# Patient Record
Sex: Female | Born: 1975 | Race: White | Hispanic: No | Marital: Single | Smoking: Never smoker
Health system: Southern US, Community
[De-identification: ages and names within clinical notes are randomized; demographics above are authoritative.]

## PROBLEM LIST (undated history)

## (undated) DIAGNOSIS — D332 Benign neoplasm of brain, unspecified: Secondary | ICD-10-CM

## (undated) DIAGNOSIS — I639 Cerebral infarction, unspecified: Secondary | ICD-10-CM

## (undated) HISTORY — PX: CERVICAL ABLATION: SHX5771

---

## 2000-07-30 ENCOUNTER — Other Ambulatory Visit: Admission: RE | Admit: 2000-07-30 | Discharge: 2000-07-30 | Payer: Self-pay | Admitting: Family Medicine

## 2000-08-11 ENCOUNTER — Other Ambulatory Visit: Admission: RE | Admit: 2000-08-11 | Discharge: 2000-08-11 | Payer: Self-pay | Admitting: Family Medicine

## 2002-01-01 ENCOUNTER — Other Ambulatory Visit: Admission: RE | Admit: 2002-01-01 | Discharge: 2002-01-01 | Payer: Self-pay | Admitting: Family Medicine

## 2004-01-20 ENCOUNTER — Other Ambulatory Visit: Admission: RE | Admit: 2004-01-20 | Discharge: 2004-01-20 | Payer: Self-pay | Admitting: Family Medicine

## 2004-01-20 ENCOUNTER — Other Ambulatory Visit: Admission: RE | Admit: 2004-01-20 | Discharge: 2004-01-20 | Payer: Self-pay | Admitting: *Deleted

## 2004-03-30 ENCOUNTER — Ambulatory Visit (HOSPITAL_COMMUNITY): Admission: RE | Admit: 2004-03-30 | Discharge: 2004-03-30 | Payer: Self-pay | Admitting: Obstetrics and Gynecology

## 2004-07-19 ENCOUNTER — Other Ambulatory Visit: Admission: RE | Admit: 2004-07-19 | Discharge: 2004-07-19 | Payer: Self-pay | Admitting: Obstetrics and Gynecology

## 2004-12-07 ENCOUNTER — Other Ambulatory Visit: Admission: RE | Admit: 2004-12-07 | Discharge: 2004-12-07 | Payer: Self-pay | Admitting: Obstetrics and Gynecology

## 2005-03-08 ENCOUNTER — Other Ambulatory Visit: Admission: RE | Admit: 2005-03-08 | Discharge: 2005-03-08 | Payer: Self-pay | Admitting: Obstetrics and Gynecology

## 2005-09-16 ENCOUNTER — Other Ambulatory Visit: Admission: RE | Admit: 2005-09-16 | Discharge: 2005-09-16 | Payer: Self-pay | Admitting: Obstetrics and Gynecology

## 2011-09-25 ENCOUNTER — Other Ambulatory Visit: Payer: Self-pay | Admitting: Obstetrics and Gynecology

## 2016-01-22 DIAGNOSIS — F329 Major depressive disorder, single episode, unspecified: Secondary | ICD-10-CM | POA: Insufficient documentation

## 2016-01-22 DIAGNOSIS — F32A Depression, unspecified: Secondary | ICD-10-CM | POA: Insufficient documentation

## 2016-02-16 ENCOUNTER — Encounter (HOSPITAL_COMMUNITY): Payer: Self-pay | Admitting: Psychiatry

## 2016-02-16 ENCOUNTER — Ambulatory Visit (INDEPENDENT_AMBULATORY_CARE_PROVIDER_SITE_OTHER): Payer: Managed Care, Other (non HMO) | Admitting: Psychiatry

## 2016-02-16 VITALS — BP 110/62 | HR 80 | Ht 62.5 in | Wt 107.0 lb

## 2016-02-16 DIAGNOSIS — F411 Generalized anxiety disorder: Secondary | ICD-10-CM | POA: Diagnosis not present

## 2016-02-16 DIAGNOSIS — F0630 Mood disorder due to known physiological condition, unspecified: Secondary | ICD-10-CM

## 2016-02-16 DIAGNOSIS — F331 Major depressive disorder, recurrent, moderate: Secondary | ICD-10-CM

## 2016-02-16 MED ORDER — FLUOXETINE HCL 10 MG PO CAPS: 30.0000 mg | ORAL_CAPSULE | Freq: Every day | ORAL | Status: DC

## 2016-02-16 MED ORDER — BUPROPION HCL ER (XL) 150 MG PO TB24: 150.0000 mg | ORAL_TABLET | Freq: Every day | ORAL | Status: DC

## 2016-02-16 NOTE — Progress Notes (Signed)
Psychiatric Initial Adult Assessment   Patient Identification: Beverly Anderson MRN:  [id number] Date of Evaluation:  02/16/2016 Referral Source: [place], [place] Chief Complaint:   Chief Complaint    Establish Care     Visit Diagnosis:    ICD-9-CM ICD-10-CM   1. 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 GAD (generalized anxiety disorder) 300.02 F41.1   3. Moderate episode of recurrent major depressive disorder (HCC) 296.32 F33.1    Diagnosis:  There are no active problems to display for this patient.  History of Present Illness:  40 years old currently single Caucasian female lives by herself referred by primary care practice for evaluation and management of depression, anxiety and possible ADD  Patient has been feeling down somewhat withdrawn for the last 2 years since her grandmother died. Her dad was diagnosed with recurrence of brain tumor in 2015 and her mom had breast cancer in 2016. Says that last 2 years has been tough and when she gets depressed she withdraws from people loses interest she is not doing her regular activities she is also having stressed at her job loss is very tough and once she gets anxious she gets forgetful and then wants gets onto her even more. She believes if she is working another job she would not be that distracted and also she is not that depressed she would not be that distracted She endorses teary days, withdrawn disturbed sleep energy. Does not feel hopeless to the point of suicide or homicidal thoughts She also endorses anxiety, excessive worries at times unreasonable. She was with her physical health about her parents.  Aggravating factors; she had a brain tumor around age 26 she had a surgery. She was bullied in school because she was in a wheelchair at that time..  Recent factors; that in family and also parents got chronic illnesses including cancer and tumor patient does not like her job stress from her boss Modifying  factors; her friends but she is not hanging out with them that much, her dog. She likes animals. balle when she was young Current meds for last one year; prozac 20mg , wellbutrin 150. No side effects reported  prozac has helped some but still endorses worries and dysphoria.  Location: depression, anxiety,  Context: job stress. 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 When she was young from age 89 to age 87   no prior psychiatric admissions or suicide attempts Past medications include Zoloft but it caused diarrhea.  Past Medical History: History reviewed. No pertinent past medical history. History reviewed. No pertinent past surgical history. Family History: History reviewed. No pertinent family history. Social History:   Social History   Social History  . Marital Status: Single    Spouse Name: N/A  . Number of Children: N/A  . Years of Education: N/A   Social History Main Topics  . Smoking status: Never Smoker   . Smokeless tobacco: None  . Alcohol Use: No  .  Drug Use: No  . Sexual Activity:    Partners: Male   Other Topics Concern  . None   Social History Narrative  . None   Additional Social History: Grew up with her parents she was born full-term but 5 pounds and weight her twin brother died at birth. She  has had a brain tumor ganglioma operated  at age 47. It was difficult eating with the kids because she was in a wheelchair and was bullied in school after that.  She never got married does not have any kids. She was the only sibling. 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 No legal issues   Musculoskeletal: Strength & Muscle Tone: within normal limits Gait & Station: normal Patient leans: no lean  Psychiatric Specialty Exam: HPI  Review of Systems  Constitutional: Negative for fever.  Cardiovascular: Negative for chest pain.  Skin: Negative for rash.  Neurological: Negative for tremors.  Psychiatric/Behavioral: Positive for depression. Negative for suicidal ideas and substance abuse. The patient is nervous/anxious.     Blood pressure 110/62, pulse 80, height 5' 2.5" (1.588 m), weight 107 lb (48.535 kg), SpO2 97 %.Body mass index is 19.25 kg/(m^2).  General Appearance: Casual  Eye Contact:  Fair  Speech:  Normal Rate  Volume:  Normal  Mood:  Dysphoric  Affect:  Constricted and Tearful  Thought Process:  Coherent  Orientation:  Full (Time, Place, and Person)  Thought Content:  Rumination  Suicidal Thoughts:  No  Homicidal Thoughts:  No  Memory:  Immediate;   Fair Recent;   Fair  Judgement:  Fair  Insight:  Shallow  Psychomotor Activity:  Normal  Concentration:  Fair  Recall:  [place]: Fair  Akathisia:  Negative  Handed:  Right  AIMS (if indicated):    Assets:  Desire for Improvement Social Support  ADL's:  Intact  Cognition: WNL  Sleep:  fair   Is the patient at risk to self?  No. Has the patient been a risk to self in the past 6 months?  No. Has the patient been a risk to self within the distant past?  No. Is the patient a risk to others?  No. Has the patient been a risk to others in the past 6 months?  No. Has the patient been a  risk to others within the distant past?  No.  Allergies:   Allergies  Allergen Reactions  . Cefaclor Hives  . Erythromycin Hives  . Shellfish-Derived Products Hives   Current Medications: Current Outpatient Prescriptions  Medication Sig Dispense Refill  . buPROPion (WELLBUTRIN XL) 150 MG 24 hr tablet Take 1 tablet (150 mg total) by mouth daily. 30 tablet 0  . Cyanocobalamin (B-12) 1000 MCG CAPS Take by mouth.    Marland Kitchen FLUoxetine (PROZAC) 10 MG capsule Take 3 capsules (30 mg total) by mouth daily. 90 capsule 0   No current facility-administered medications for this visit.    Previous Psychotropic Medications: Yes  Zoloft caused diarrhea  Substance Abuse History in the last 12 months:  No.  Consequences of Substance Abuse: NA  Medical Decision Making:  Review of Psycho-Social Stressors (1), Established Problem, Worsening (2) and Review of New Medication or Change in Dosage (2)  Treatment Plan Summary: Medication management and Plan as follows   Major depression recurrent : Increase Prozac to 30 mg.  Continue Wellbutrin 150 mg .  If needed we can increase further prozac next visit or wellbutrin.   generalized anxiety disorder; Prozac as above Mood disorder but also related her stressors currently and losing her grandmother. Sickness in her family and having to deal with a brain tumor when she was young and not so good memories of school years. More than 50% time spent in counseling and coordination of care including patient education. Patient's looking for a job as her current bosses very difficult for her to deal with. Apparently her forgetfulness and also be related with her stress and anxiety with her job Patient is a nonsmoker Recommend psychotherapy for counseling in dealing with her psychosocial issues  Follow-up in 3-4 weeks or earlier if needed call 911 or report (if any urgent concerns or suicidal thoughts     Dayani Winbush 3/17/20179:50 AM

## 2016-02-22 ENCOUNTER — Telehealth (HOSPITAL_COMMUNITY): Payer: Self-pay | Admitting: *Deleted

## 2016-02-22 NOTE — Telephone Encounter (Signed)
Prior authorization for Fluoxetine received. Called [phone] spoke with [facility] who gave approval [id number] good until 05/24/16. Called to notify pharmacy.

## 2016-03-07 ENCOUNTER — Ambulatory Visit (HOSPITAL_COMMUNITY): Payer: Self-pay | Admitting: Psychiatry

## 2016-03-18 ENCOUNTER — Ambulatory Visit (HOSPITAL_COMMUNITY): Payer: Self-pay | Admitting: Psychiatry

## 2016-04-13 ENCOUNTER — Other Ambulatory Visit (HOSPITAL_COMMUNITY): Payer: Self-pay | Admitting: Psychiatry

## 2016-04-16 NOTE — Telephone Encounter (Signed)
Received medication request from [facility] for Prozac 10mg . Per Dr. De Nurse, medication request is denied.  Pt will need to schedule appt with the clinic. LVM for pt to return call to clinic to schedule appt.

## 2016-07-10 ENCOUNTER — Other Ambulatory Visit (HOSPITAL_COMMUNITY): Payer: Self-pay | Admitting: Psychiatry

## 2016-07-10 NOTE — Telephone Encounter (Signed)
Received medication request from [facility] for Prozac 10mg . Per Dr. De Nurse, medication request is denied.  Pt will need to schedule appt with the clinic. LVM for pt to return call to clinic to schedule appt.

## 2016-11-08 ENCOUNTER — Ambulatory Visit (INDEPENDENT_AMBULATORY_CARE_PROVIDER_SITE_OTHER): Payer: Self-pay | Admitting: Psychiatry

## 2016-11-08 ENCOUNTER — Encounter (HOSPITAL_COMMUNITY): Payer: Self-pay | Admitting: Psychiatry

## 2016-11-08 VITALS — BP 102/80 | HR 78 | Wt 114.0 lb

## 2016-11-08 DIAGNOSIS — F3281 Premenstrual dysphoric disorder: Secondary | ICD-10-CM

## 2016-11-08 DIAGNOSIS — Z91013 Allergy to seafood: Secondary | ICD-10-CM

## 2016-11-08 DIAGNOSIS — Z79899 Other long term (current) drug therapy: Secondary | ICD-10-CM

## 2016-11-08 DIAGNOSIS — F331 Major depressive disorder, recurrent, moderate: Secondary | ICD-10-CM

## 2016-11-08 DIAGNOSIS — Z888 Allergy status to other drugs, medicaments and biological substances status: Secondary | ICD-10-CM

## 2016-11-08 DIAGNOSIS — F411 Generalized anxiety disorder: Secondary | ICD-10-CM

## 2016-11-08 DIAGNOSIS — F0630 Mood disorder due to known physiological condition, unspecified: Secondary | ICD-10-CM

## 2016-11-08 MED ORDER — FLUOXETINE HCL 40 MG PO CAPS: 40.0000 mg | ORAL_CAPSULE | Freq: Every day | ORAL | 1 refills | Status: DC

## 2016-11-08 MED ORDER — BUPROPION HCL ER (XL) 150 MG PO TB24: 150.0000 mg | ORAL_TABLET | Freq: Every day | ORAL | 1 refills | Status: DC

## 2016-11-08 NOTE — Progress Notes (Signed)
[facility] Outpatient Follow up visit  Patient Identification: Beverly Anderson MRN:  [id number] Date of Evaluation:  11/08/2016 Referral Source: [place], [place] Chief Complaint:   Chief Complaint    Follow-up     Visit Diagnosis:  No diagnosis found. Diagnosis:   Patient Active Problem List   Diagnosis Date Noted  . Clinical depression [F32.9] 01/22/2016   History of Present Illness:  40 years old currently single Caucasian female lives by herself referred by primary care practice for evaluation and management of depression, anxiety and possible ADD  Patient is returning after 8 months is that because of insurance reasons and she was not able to follow she does have some refills available on Wellbutrin and Prozac she continued. Her depression is less intense but still there are 5 out of 7. She has multiple losses including her grandmother passed away in the last 2 years and other people in the family members Modifying factors are her friends and also she likes animals and ballet dancing when she was young   She does worry, excessive at times her dad has a brain tumor her mom was sick but she is doing better  She is more concerned about premenstrual dysphoria during the menstrual cycle she gets more emotional and crying spells   No manic or psychotic symptoms  denies drug use    Past Medical History: History reviewed. No pertinent past medical history. History reviewed. No pertinent surgical history. Family History: History reviewed. No pertinent family history. Social History:   Social History   Social History  . Marital status: Single    Spouse name: N/A  . Number of children: N/A  . Years of education: N/A   Social History Main Topics  . Smoking status: Never Smoker  . Smokeless tobacco: None  . Alcohol use No  . Drug use: No  . Sexual activity: Not Currently    Partners: Male   Other Topics Concern  . None   Social History Narrative  . None        Psychiatric  Specialty Exam: HPI  Review of Systems  Constitutional: Negative for fever.  Cardiovascular: Negative for chest pain and palpitations.  Gastrointestinal: Negative for vomiting.  Skin: Negative for rash.  Neurological: Negative for tremors.  Psychiatric/Behavioral: Positive for depression. Negative for substance abuse and suicidal ideas.    Blood pressure 102/80, pulse 78, weight 114 lb (51.7 kg).Body mass index is 20.52 kg/m.  General Appearance: Casual  Eye Contact:  Fair  Speech:  Normal Rate  Volume:  Normal  Mood:  Somewhat dysphoric  Affect:  constricted  Thought Process:  Coherent  Orientation:  Full (Time, Place, and Person)  Thought Content:  Rumination  Suicidal Thoughts:  No  Homicidal Thoughts:  No  Memory:  Immediate;   Fair Recent;   Fair  Judgement:  Fair  Insight:  Shallow  Psychomotor Activity:  Normal  Concentration:  Fair  Recall:  [place]: Fair  Akathisia:  Negative  Handed:  Right  AIMS (if indicated):    Assets:  Desire for Improvement Social Support  ADL's:  Intact  Cognition: WNL  Sleep:  fair    Allergies:   Allergies  Allergen Reactions  . Cefaclor Hives  . Erythromycin Hives  . Shellfish-Derived Products Hives   Current Medications: Current Outpatient Prescriptions  Medication Sig Dispense Refill  . buPROPion (WELLBUTRIN XL) 150 MG 24 hr tablet Take 1 tablet (150 mg total) by mouth daily. [place]  tablet 1  . Cyanocobalamin (B-12) 1000 MCG CAPS Take by mouth.    Marland Kitchen FLUoxetine (PROZAC) 40 MG capsule Take 1 capsule (40 mg total) by mouth daily. 30 capsule 1   No current facility-administered medications for this visit.       Treatment Plan Summary: Medication management and Plan as follows   1. MDD severe: improved some. Increase prozac to 40mg  . Continue wellbutrin 2. GAD: prozac as above 3. Pre menstrual dysphoria; increase prozac to 40mg .  This was getting worse.  Prescriptions sent Recommend  psychotherapy for counseling in dealing with her psychosocial issues Says because of insurance reasons will consider later  Follow-up 4-6 weeks or earlier if needed.     Sanjuana Mruk 12/8/201710:56 AM

## 2017-01-03 ENCOUNTER — Ambulatory Visit (HOSPITAL_COMMUNITY): Payer: Self-pay | Admitting: Psychiatry

## 2017-01-15 ENCOUNTER — Encounter (HOSPITAL_COMMUNITY): Payer: Self-pay | Admitting: Psychiatry

## 2017-01-15 ENCOUNTER — Ambulatory Visit (INDEPENDENT_AMBULATORY_CARE_PROVIDER_SITE_OTHER): Payer: Self-pay | Admitting: Psychiatry

## 2017-01-15 VITALS — BP 128/78 | HR 78 | Wt 109.0 lb

## 2017-01-15 DIAGNOSIS — Z79899 Other long term (current) drug therapy: Secondary | ICD-10-CM

## 2017-01-15 DIAGNOSIS — Z91013 Allergy to seafood: Secondary | ICD-10-CM

## 2017-01-15 DIAGNOSIS — F411 Generalized anxiety disorder: Secondary | ICD-10-CM

## 2017-01-15 DIAGNOSIS — F331 Major depressive disorder, recurrent, moderate: Secondary | ICD-10-CM

## 2017-01-15 DIAGNOSIS — Z888 Allergy status to other drugs, medicaments and biological substances status: Secondary | ICD-10-CM

## 2017-01-15 DIAGNOSIS — F0630 Mood disorder due to known physiological condition, unspecified: Secondary | ICD-10-CM

## 2017-01-15 MED ORDER — FLUOXETINE HCL 40 MG PO CAPS: 40.0000 mg | ORAL_CAPSULE | Freq: Every day | ORAL | 1 refills | Status: DC

## 2017-01-15 MED ORDER — BUPROPION HCL ER (XL) 150 MG PO TB24: 150.0000 mg | ORAL_TABLET | Freq: Every day | ORAL | 1 refills | Status: DC

## 2017-01-15 NOTE — Progress Notes (Signed)
[facility] Outpatient Follow up visit  Patient Identification: Beverly Anderson MRN:  [id number] Date of Evaluation:  01/15/2017 Referral Source: [place], [place] Chief Complaint:   Chief Complaint    Follow-up     Visit Diagnosis:    ICD-9-CM ICD-10-CM   1. 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 GAD (generalized anxiety disorder) 300.02 F41.1   3. Moderate episode of recurrent major depressive disorder (HCC) 296.32 F33.1    Diagnosis:   Patient Active Problem List   Diagnosis Date Noted  . Clinical depression [F32.9] 01/22/2016   History of Present Illness:  41 years old currently single Caucasian female lives by herself referred by primary care practice for evaluation and management of depression, anxiety and possible ADD  Patient is going to school somewhat stressed also is working. She said she tries to avoid people because people judge her she has had a brain tumor in the past and she has had difficult time in growing up in school. Overall medication adjustment and increase in Prozac has helped her depression and anxiety   Modifying factors are her friends and also she likes animals and ballet dancing when she was young   She worries about having a job with people of attitude Sleep hours are less due to school may be contributing to inattention.   No manic or psychotic symptoms  denies drug use    Past Medical History: History reviewed. No pertinent past medical history. History reviewed. No pertinent surgical history. Family History: History reviewed. No pertinent family history. Social History:   Social History   Social History  . Marital status: Single    Spouse name: N/A  . Number of children: N/A  . Years of education: N/A   Social History Main Topics  . Smoking status: Never Smoker  . Smokeless tobacco: Never Used  . Alcohol use No  . Drug use: No  . Sexual activity: Not Currently    Partners: Male   Other Topics Concern  . None    Social History Narrative  . None        Psychiatric Specialty Exam: HPI  Review of Systems  Constitutional: Negative for fever.  Gastrointestinal: Negative for vomiting.  Skin: Negative for itching.  Neurological: Negative for tremors.  Psychiatric/Behavioral: Negative for substance abuse and suicidal ideas.    Blood pressure 128/78, pulse 78, weight 109 lb (49.4 kg).Body mass index is 19.62 kg/m.  General Appearance: Casual  Eye Contact:  Fair  Speech:  Normal Rate  Volume:  Normal  Mood:  Less dysphoric  Affect:  constricted  Thought Process:  Coherent  Orientation:  Full (Time, Place, and Person)  Thought Content:  Rumination  Suicidal Thoughts:  No  Homicidal Thoughts:  No  Memory:  Immediate;   Fair Recent;   Fair  Judgement:  Fair  Insight:  Shallow  Psychomotor Activity:  Normal  Concentration:  Fair  Recall:  [place]: Fair  Akathisia:  Negative  Handed:  Right  AIMS (if indicated):    Assets:  Desire for Improvement Social Support  ADL's:  Intact  Cognition: WNL  Sleep:  fair    Allergies:   Allergies  Allergen Reactions  . Cefaclor Hives  . Erythromycin Hives  . Shellfish-Derived Products Hives   Current Medications: Current Outpatient Prescriptions  Medication Sig Dispense Refill  . buPROPion (WELLBUTRIN XL) 150 MG 24 hr tablet Take 1 tablet (150 mg total) by mouth daily. [place]  tablet 1  . Cyanocobalamin (B-12) 1000 MCG CAPS Take by mouth.    Marland Kitchen FLUoxetine (PROZAC) 40 MG capsule Take 1 capsule (40 mg total) by mouth daily. 30 capsule 1   No current facility-administered medications for this visit.       Treatment Plan Summary: Medication management and Plan as follows   1. MDD severe:improved. Continue prozac and wellbutrin 2. [id number].  3. Pre menstrual dysphoria; improved with increase in prozac 40mg . Will continue  Prescriptions sent Side effects reviewed. Sleep hygiene reviewed to get adequate  hours of sleep it may be effecting her inattention. FU 2 months    Hasna Stefanik 2/14/201810:56 AM

## 2017-03-12 ENCOUNTER — Ambulatory Visit (HOSPITAL_COMMUNITY): Payer: Self-pay | Admitting: Psychiatry

## 2017-04-27 ENCOUNTER — Other Ambulatory Visit (HOSPITAL_COMMUNITY): Payer: Self-pay | Admitting: Psychiatry

## 2017-04-29 NOTE — Telephone Encounter (Signed)
Received medication request from [facility] for Prozac.  Per Dr. De Nurse, medication request is denied. Pt will need to schedule appt with the clinic. LVM for pt to return call to clinic to schedule appt.

## 2017-06-11 ENCOUNTER — Ambulatory Visit (INDEPENDENT_AMBULATORY_CARE_PROVIDER_SITE_OTHER): Payer: Self-pay | Admitting: Psychiatry

## 2017-06-11 ENCOUNTER — Encounter (HOSPITAL_COMMUNITY): Payer: Self-pay | Admitting: Psychiatry

## 2017-06-11 VITALS — BP 114/70 | HR 98 | Resp 16 | Ht 62.5 in | Wt 110.2 lb

## 2017-06-11 DIAGNOSIS — F331 Major depressive disorder, recurrent, moderate: Secondary | ICD-10-CM

## 2017-06-11 DIAGNOSIS — F3281 Premenstrual dysphoric disorder: Secondary | ICD-10-CM

## 2017-06-11 DIAGNOSIS — F0630 Mood disorder due to known physiological condition, unspecified: Secondary | ICD-10-CM

## 2017-06-11 DIAGNOSIS — F411 Generalized anxiety disorder: Secondary | ICD-10-CM

## 2017-06-11 MED ORDER — FLUOXETINE HCL 40 MG PO CAPS: 40.0000 mg | ORAL_CAPSULE | Freq: Every day | ORAL | 2 refills | Status: DC

## 2017-06-11 MED ORDER — BUPROPION HCL ER (XL) 150 MG PO TB24: 150.0000 mg | ORAL_TABLET | Freq: Every day | ORAL | 2 refills | Status: DC

## 2017-06-11 NOTE — Progress Notes (Signed)
[facility] Outpatient Follow up visit  Patient Identification: Beverly Anderson MRN:  [id number] Date of Evaluation:  06/11/2017 Referral Source: [place], [place] Chief Complaint:   Chief Complaint    Follow-up     Visit Diagnosis:    ICD-10-CM   1. Mood disorder in conditions classified elsewhere F06.30   2. GAD (generalized anxiety disorder) F41.1   3. Moderate episode of recurrent major depressive disorder (HCC) F33.1    Diagnosis:   Patient Active Problem List   Diagnosis Date Noted  . Clinical depression [F32.9] 01/22/2016   History of Present Illness:  41 years old currently single Caucasian female lives by herself referred by primary care practice for evaluation and management of depression, anxiety and possible ADD  She has had a change in jobs lost her insurance so she has been off medications for the last 1 month not feeling down somewhat sluggish fatigue and depressed but not tearful or suicidal  She has had a brain surgery about brain tumor in the past and says that she is mostly sluggish or symptoms of problem remembering at times it becomes worse.  Depression worse. Since not on meds    Modifying factors are her friends and also she likes animals and ballet dancing when she was young . But not doing much due to tirendess  Job stress and people attitude puts her down   denies drug use    Past Medical History: History reviewed. No pertinent past medical history. History reviewed. No pertinent surgical history. Family History: History reviewed. No pertinent family history. Social History:   Social History   Social History  . Marital status: Single    Spouse name: N/A  . Number of children: N/A  . Years of education: N/A   Social History Main Topics  . Smoking status: Never Smoker  . Smokeless tobacco: Never Used  . Alcohol use No  . Drug use: No  . Sexual activity: Yes    Partners: Male   Other Topics Concern  . None   Social History Narrative  . None         Psychiatric Specialty Exam: HPI  Review of Systems  Constitutional: Positive for malaise/fatigue. Negative for fever.  Gastrointestinal: Negative for vomiting.  Skin: Negative for itching.  Neurological: Negative for tremors.  Psychiatric/Behavioral: Positive for depression. Negative for substance abuse and suicidal ideas.    Blood pressure 114/70, pulse 98, resp. rate 16, height 5' 2.5" (1.588 m), weight 110 lb 3.2 oz (50 kg), SpO2 97 %.Body mass index is 19.83 kg/m.  General Appearance: Casual  Eye Contact:  Fair  Speech:  Normal Rate  Volume:  Normal  Mood:  dysphoric  Affect:  constricted  Thought Process:  Coherent  Orientation:  Full (Time, Place, and Person)  Thought Content:  Rumination  Suicidal Thoughts:  No  Homicidal Thoughts:  No  Memory:  Immediate;   Fair Recent;   Fair  Judgement:  Fair  Insight:  Shallow  Psychomotor Activity:  Normal  Concentration:  Fair  Recall:  [place]: Fair  Akathisia:  Negative  Handed:  Right  AIMS (if indicated):    Assets:  Desire for Improvement Social Support  ADL's:  Intact  Cognition: WNL  Sleep:  fair    Allergies:   Allergies  Allergen Reactions  . Cefaclor Hives  . Erythromycin Hives  . Shellfish-Derived Products Hives   Current Medications: Current Outpatient Prescriptions  Medication Sig Dispense Refill  .  buPROPion (WELLBUTRIN XL) 150 MG 24 hr tablet Take 1 tablet (150 mg total) by mouth daily. 30 tablet 2  . FLUoxetine (PROZAC) 40 MG capsule Take 1 capsule (40 mg total) by mouth daily. 30 capsule 2   No current facility-administered medications for this visit.       Treatment Plan Summary: Medication management and Plan as follows   1. MDD recurrent: relapse with symptoms since not on meds. Will re instate wellbutrin and prozac  2. NGB:MBOMQTTCN with stress. Re start prozac 3. Pre menstrual dysphoria; persist prozac helps. Will continue  Reviewed side  effects. Questions addressed FU 2-3 months discussed compliance FU with neurology if concerns of forgetfullness persist after recovering from depression and back on meds    Flannery Cavallero 7/11/20189:10 AM

## 2017-09-04 ENCOUNTER — Ambulatory Visit (HOSPITAL_COMMUNITY): Payer: Self-pay | Admitting: Psychiatry

## 2017-09-08 ENCOUNTER — Ambulatory Visit (INDEPENDENT_AMBULATORY_CARE_PROVIDER_SITE_OTHER): Payer: 59 | Admitting: Psychiatry

## 2017-09-08 ENCOUNTER — Encounter (HOSPITAL_COMMUNITY): Payer: Self-pay | Admitting: Psychiatry

## 2017-09-08 VITALS — BP 100/66 | HR 62 | Resp 16 | Ht 62.5 in | Wt 111.0 lb

## 2017-09-08 DIAGNOSIS — F331 Major depressive disorder, recurrent, moderate: Secondary | ICD-10-CM

## 2017-09-08 DIAGNOSIS — F3281 Premenstrual dysphoric disorder: Secondary | ICD-10-CM | POA: Diagnosis not present

## 2017-09-08 DIAGNOSIS — F411 Generalized anxiety disorder: Secondary | ICD-10-CM | POA: Diagnosis not present

## 2017-09-08 DIAGNOSIS — Z79899 Other long term (current) drug therapy: Secondary | ICD-10-CM

## 2017-09-08 DIAGNOSIS — F0630 Mood disorder due to known physiological condition, unspecified: Secondary | ICD-10-CM | POA: Diagnosis not present

## 2017-09-08 DIAGNOSIS — Z566 Other physical and mental strain related to work: Secondary | ICD-10-CM | POA: Diagnosis not present

## 2017-09-08 MED ORDER — BUPROPION HCL ER (XL) 150 MG PO TB24: 150.0000 mg | ORAL_TABLET | Freq: Every day | ORAL | 2 refills | Status: DC

## 2017-09-08 MED ORDER — FLUOXETINE HCL 40 MG PO CAPS: 40.0000 mg | ORAL_CAPSULE | Freq: Every day | ORAL | 2 refills | Status: DC

## 2017-09-08 NOTE — Progress Notes (Signed)
[facility] Outpatient Follow up visit  Patient Identification: Beverly Anderson MRN:  [id number] Date of Evaluation:  09/08/2017 Referral Source: [place], [place] Chief Complaint:   Chief Complaint    Follow-up     Visit Diagnosis:    ICD-10-CM   1. Mood disorder in conditions classified elsewhere F06.30   2. GAD (generalized anxiety disorder) F41.1   3. Moderate episode of recurrent major depressive disorder (HCC) F33.1   4. Premenstrual dysphoria F32.81    Diagnosis:   Patient Active Problem List   Diagnosis Date Noted  . Clinical depression [F32.9] 01/22/2016   History of Present Illness:  41 years old currently single Caucasian female lives by herself referred by primary care practice for evaluation and management of depression, anxiety and possible ADD  She is back on Wellbutrin and Prozac it is helping. She has had a brain surgery about the brain tumor in the past at times she does feel sluggish difficulty remembering things but overall she does do better when she is on medications She has been diagnosed with endometriosis and they were having premenstrual dysphoric disorder Prozac does help she is now on hormone therapy other options for surgery but the surgeon this somewhat reluctant to do that considering her young age  Depression : improved   Modifying factors : friends also she likes animals and ballet dancing when she was young . But not doing much due to tirendess  Job stress along with school upset her Recent BF has been warned of alcohol use and is now quit. She told him not have this stress if need be together   denies drug use    Past Medical History: History reviewed. No pertinent past medical history. History reviewed. No pertinent surgical history. Family History: History reviewed. No pertinent family history. Social History:   Social History   Social History  . Marital status: Single    Spouse name: N/A  . Number of children: N/A  . Years of education: N/A    Social History Main Topics  . Smoking status: Never Smoker  . Smokeless tobacco: Never Used  . Alcohol use No  . Drug use: No  . Sexual activity: Yes    Partners: Male   Other Topics Concern  . None   Social History Narrative  . None        Psychiatric Specialty Exam: HPI  Review of Systems  Constitutional: Positive for malaise/fatigue. Negative for fever.  Cardiovascular: Negative for chest pain.  Gastrointestinal: Negative for vomiting.  Skin: Negative for itching.  Neurological: Negative for tremors.  Psychiatric/Behavioral: Negative for substance abuse and suicidal ideas.    Blood pressure 100/66, pulse 62, resp. rate 16, height 5' 2.5" (1.588 m), weight 111 lb (50.3 kg), SpO2 97 %.Body mass index is 19.98 kg/m.  General Appearance: Casual  Eye Contact:  Fair  Speech:  Normal Rate  Volume:  Normal  Mood: euthymic  Affect:  congruent  Thought Process:  Coherent  Orientation:  Full (Time, Place, and Person)  Thought Content:  Rumination  Suicidal Thoughts:  No  Homicidal Thoughts:  No  Memory:  Immediate;   Fair Recent;   Fair  Judgement:  Fair  Insight:  Shallow  Psychomotor Activity:  Normal  Concentration:  Fair  Recall:  [place]: Fair  Akathisia:  Negative  Handed:  Right  AIMS (if indicated):    Assets:  Desire for Improvement Social Support  ADL's:  Intact  Cognition: WNL  Sleep:  fair    Allergies:   Allergies  Allergen Reactions  . Cefaclor Hives  . Erythromycin Hives  . Shellfish-Derived Products Hives   Current Medications: Current Outpatient Prescriptions  Medication Sig Dispense Refill  . buPROPion (WELLBUTRIN XL) 150 MG 24 hr tablet Take 1 tablet (150 mg total) by mouth daily. 30 tablet 2  . etonogestrel-ethinyl estradiol (NUVARING) 0.12-0.015 MG/24HR vaginal ring Place 1 ring vaginally every month.    Marland Kitchen FLUoxetine (PROZAC) 40 MG capsule Take 1 capsule (40 mg total) by mouth daily. 30 capsule 2    No current facility-administered medications for this visit.       Treatment Plan Summary: Medication management and Plan as follows   1. MDD recurrent: improved since back on meds. Will continue wellbutrin, prozac  2. GAD better. Somewhat steressed about BF drinking concerns 3. Pre menstrual dysphoria; less on prozac.  Evaluation for treatment of endometriosis ongoing. Currently put on birth control  Questions addressed patient doing somewhat better follow-up in 3 months reviewed medications   [name] 10/8/20183:56 PM

## 2017-12-08 ENCOUNTER — Ambulatory Visit (HOSPITAL_COMMUNITY): Payer: 59 | Admitting: Psychiatry

## 2018-04-28 ENCOUNTER — Emergency Department
Admission: EM | Admit: 2018-04-28 | Discharge: 2018-04-28 | Disposition: A | Discharge status: Home / Self Care | Payer: No Typology Code available for payment source | Attending: Student in an Organized Health Care Education/Training Program | Admitting: Student in an Organized Health Care Education/Training Program

## 2018-04-28 ENCOUNTER — Encounter: Payer: Self-pay | Admitting: Emergency Medicine

## 2018-04-28 ENCOUNTER — Other Ambulatory Visit: Payer: Self-pay

## 2018-04-28 DIAGNOSIS — T5991XA Toxic effect of unspecified gases, fumes and vapors, accidental (unintentional), initial encounter: Secondary | ICD-10-CM | POA: Insufficient documentation

## 2018-04-28 DIAGNOSIS — R42 Dizziness and giddiness: Secondary | ICD-10-CM | POA: Diagnosis present

## 2018-04-28 DIAGNOSIS — Z79899 Other long term (current) drug therapy: Secondary | ICD-10-CM | POA: Insufficient documentation

## 2018-04-28 DIAGNOSIS — Z77098 Contact with and (suspected) exposure to other hazardous, chiefly nonmedicinal, chemicals: Secondary | ICD-10-CM

## 2018-04-28 HISTORY — DX: Cerebral infarction, unspecified: I63.9

## 2018-04-28 HISTORY — DX: Benign neoplasm of brain, unspecified: D33.2

## 2018-04-28 LAB — CBC
HCT: 38.5 % (ref 35.0–47.0)
Hemoglobin: 13.2 g/dL (ref 12.0–16.0)
MCH: 29.1 pg (ref 26.0–34.0)
MCHC: 34.1 g/dL (ref 32.0–36.0)
MCV: 85.3 fL (ref 80.0–100.0)
Platelets: 273 10*3/uL (ref 150–440)
RBC: 4.52 MIL/uL (ref 3.80–5.20)
RDW: 13.4 % (ref 11.5–14.5)
WBC: 4.1 10*3/uL (ref 3.6–11.0)

## 2018-04-28 LAB — BASIC METABOLIC PANEL
Anion gap: 8 (ref 5–15)
BUN: 9 mg/dL (ref 6–20)
CO2: 28 mmol/L (ref 22–32)
Calcium: 8.9 mg/dL (ref 8.9–10.3)
Chloride: 100 mmol/L — ABNORMAL LOW (ref 101–111)
Creatinine, Ser: 0.59 mg/dL (ref 0.44–1.00)
GFR calc Af Amer: >60 mL/min (ref >60)
GFR calc non Af Amer: >60 mL/min (ref >60)
Glucose, Bld: 93 mg/dL (ref 65–99)
Potassium: 3.9 mmol/L (ref 3.5–5.1)
Sodium: 136 mmol/L (ref 135–145)
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
## 2018-04-28 NOTE — ED Triage Notes (Signed)
Says exposed to something at work.  Says she has nuasea, vomiting, dizziness and metalic taste.

## 2018-04-28 NOTE — ED Notes (Signed)
Kandis Ban hr at  [employer] tobacco says no uds for [employer]

## 2018-04-28 NOTE — Discharge Instructions (Signed)
Patient advised when she returned back to work if she noticed symptoms returning notify supervisor immediately the area.

## 2018-05-13 ENCOUNTER — Other Ambulatory Visit (HOSPITAL_COMMUNITY): Payer: Self-pay | Admitting: Psychiatry

## 2018-07-10 ENCOUNTER — Emergency Department (HOSPITAL_BASED_OUTPATIENT_CLINIC_OR_DEPARTMENT_OTHER)
Admission: EM | Admit: 2018-07-10 | Discharge: 2018-07-10 | Disposition: A | Discharge status: Home / Self Care | Payer: No Typology Code available for payment source | Attending: Emergency Medicine | Admitting: Emergency Medicine

## 2018-07-10 ENCOUNTER — Other Ambulatory Visit: Payer: Self-pay

## 2018-07-10 ENCOUNTER — Encounter (HOSPITAL_BASED_OUTPATIENT_CLINIC_OR_DEPARTMENT_OTHER): Payer: Self-pay | Admitting: *Deleted

## 2018-07-10 ENCOUNTER — Emergency Department (HOSPITAL_BASED_OUTPATIENT_CLINIC_OR_DEPARTMENT_OTHER): Payer: No Typology Code available for payment source

## 2018-07-10 DIAGNOSIS — Y929 Unspecified place or not applicable: Secondary | ICD-10-CM | POA: Insufficient documentation

## 2018-07-10 DIAGNOSIS — Z79899 Other long term (current) drug therapy: Secondary | ICD-10-CM | POA: Diagnosis not present

## 2018-07-10 DIAGNOSIS — S060X1A Concussion with loss of consciousness of 30 minutes or less, initial encounter: Secondary | ICD-10-CM | POA: Diagnosis not present

## 2018-07-10 DIAGNOSIS — S0990XA Unspecified injury of head, initial encounter: Secondary | ICD-10-CM | POA: Diagnosis present

## 2018-07-10 DIAGNOSIS — Y9389 Activity, other specified: Secondary | ICD-10-CM | POA: Insufficient documentation

## 2018-07-10 DIAGNOSIS — W07XXXA Fall from chair, initial encounter: Secondary | ICD-10-CM | POA: Diagnosis not present

## 2018-07-10 DIAGNOSIS — Y990 Civilian activity done for income or pay: Secondary | ICD-10-CM | POA: Diagnosis not present

## 2018-07-10 NOTE — ED Provider Notes (Signed)
[facility] EMERGENCY DEPARTMENT Provider Note   CSN: [id number] Arrival date & time: 07/10/18  1302     History   Chief Complaint Chief Complaint  Patient presents with  . Head Injury    HPI Beverly Anderson is a 42 y.o. female.  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  Patient was sitting at her chair at work and leaned back to stretch when her legs came over her head and she flew backwards hitting her head on a concrete floor.  She does not recall landing on the floor and thinks she may have had brief loss of consciousness.  However since that time she had headache and fatigue.  She denies any visual changes, any new unilateral weakness or speech difficulty.  She has had no vomiting but has had a persistent headache.  She does not take anticoagulation.  The history is provided by the patient.  Head Injury   The incident occurred 2 days ago. She came to the ER via walk-in. The injury mechanism was a fall. She lost consciousness for a period of less than one minute. There was no blood loss. The quality of the pain is described as dull. The pain is at a severity of 4/10. The pain is moderate. The pain has been constant since the injury. Pertinent negatives include no numbness, no blurred vision, no vomiting, no disorientation and no weakness. Associated symptoms comments: Extreme fatigue and sleeping all the time. She has tried nothing for the symptoms. The treatment provided no relief.    Past Medical History:  Diagnosis Date  . Brain tumor (benign) ([place])   . Stroke [facility])    as a child    Patient Active Problem List   Diagnosis Date Noted  . Clinical depression 01/22/2016    Past Surgical History:  Procedure Laterality Date  . CERVICAL ABLATION       OB History   None      Home Medications    Prior to Admission  medications   Medication Sig Start Date End Date Taking? Authorizing Provider  buPROPion (WELLBUTRIN XL) 150 MG 24 hr tablet Take 1 tablet (150 mg total) by mouth daily. 09/08/17 09/08/18  [name], MD  etonogestrel-ethinyl estradiol (NUVARING) 0.12-0.015 MG/24HR vaginal ring Place 1 ring vaginally every month. 07/14/17   [provider]  FLUoxetine (PROZAC) 40 MG capsule Take 1 capsule (40 mg total) by mouth daily. 09/08/17 09/08/18  [name], MD    Family History No family history on file.  Social History Social History   Tobacco Use  . Smoking status: Never Smoker  . Smokeless tobacco: Never Used  Substance Use Topics  . Alcohol use: No    Alcohol/week: 0.0 standard drinks  . Drug use: No     Allergies   Cefaclor; Erythromycin; and Shellfish-derived products   Review of Systems Review of Systems  Eyes: Negative for blurred vision.  Gastrointestinal: Negative for vomiting.  Neurological: Negative for weakness and numbness.  All other systems reviewed and are negative.    Physical Exam Updated Vital Signs BP 117/78 (BP Location: Right Arm)   Pulse 64   Temp 98 F (36.7 C) (Oral)   Resp 18   Ht 5\' 3"  (1.6 m)   Wt 50.8 kg   LMP 06/25/2018   SpO2 100%  BMI 19.84 kg/m   Physical Exam  Constitutional: She is oriented to person, place, and time. She appears well-developed and well-nourished. No distress.  HENT:  Head: Normocephalic and atraumatic.  Mouth/Throat: Oropharynx is clear and moist.  Eyes: Pupils are equal, round, and reactive to light. Conjunctivae and EOM are normal.  Neck: Normal range of motion. Neck supple.  Cardiovascular: Normal rate, regular rhythm and intact distal pulses.  No murmur heard. Pulmonary/Chest: Effort normal and breath sounds normal. No respiratory distress. She has no wheezes. She has no rales.  Abdominal: Soft. She exhibits no distension. There is no tenderness. There is no rebound and no guarding.    Musculoskeletal: Normal range of motion. She exhibits no edema or tenderness.  Neurological: She is alert and oriented to person, place, and time. She has normal strength. No cranial nerve deficit or sensory deficit. Gait normal.  No pronator drift noted in either upper extremity or lower extremities.  Skin: Skin is warm and dry. No rash noted. No erythema.  Psychiatric: She has a normal mood and affect. Her behavior is normal.  Nursing note and vitals reviewed.    ED Treatments / Results  Labs (all labs ordered are listed, but only abnormal results are displayed) Labs Reviewed - No data to display  EKG None  Radiology Ct Head Wo Contrast  Result Date: 07/10/2018 CLINICAL DATA:  41 year old female status post fall at work 2 days ago with persistent headache and drowsiness. History of brain tumor and cerebrovascular accident at age 36. EXAM: CT HEAD WITHOUT CONTRAST TECHNIQUE: Contiguous axial images were obtained from the base of the skull through the vertex without intravenous contrast. COMPARISON:  None. FINDINGS: Brain: No evidence of acute infarction, hemorrhage, hydrocephalus, extra-axial collection or mass lesion/mass effect. Focal encephalomalacia involving the right temporal lobe and right putamen consistent with the clinical history of resected brain tumor and ischemic infarct. No evidence of complication. Vascular: No hyperdense vessel or unexpected calcification. Skull: Normal. Negative for fracture or focal lesion. Sinuses/Orbits: No acute finding. Other: None. IMPRESSION: 1. No acute intracranial abnormality. 2. Surgical changes of prior right temporal lobe resection and likely remote ischemic insult involving the right putamen. Electronically Signed   By: Jacqulynn Cadet M.D.   On: 07/10/2018 15:58    Procedures Procedures (including critical care time)  Medications Ordered in ED Medications - No data to display   Initial Impression / Assessment and Plan / ED Course  I  have reviewed the triage vital signs and the nursing notes.  Pertinent labs & imaging results that were available during my care of the patient were reviewed by me and considered in my medical decision making (see chart for details).    Patient with head injury 2 days ago and symptoms most consistent with concussion.  However given patient's extensive history of brain tumor, surgery and prior stroke we will do a CT to rule out any other acute pathology.  Patient CT shows surgical changes but no acute abnormality.  Patient given concussion precautions and follow-up.   Final Clinical Impressions(s) / ED Diagnoses   Final diagnoses:  Concussion with loss of consciousness of 30 minutes or less, initial encounter    ED Discharge Orders    None       Blanchie Dessert, MD 07/10/18 1719

## 2018-07-10 NOTE — ED Triage Notes (Signed)
Her chair turned over backward while at work 2 days ago. She hit the back of her head and had possible LOC. She was seen at [facility] after the injury. She has been sleepy since the injury.

## 2018-08-24 ENCOUNTER — Other Ambulatory Visit (HOSPITAL_COMMUNITY): Payer: Self-pay | Admitting: Psychiatry

## 2018-08-31 ENCOUNTER — Other Ambulatory Visit (HOSPITAL_COMMUNITY): Payer: Self-pay | Admitting: Psychiatry

## 2018-10-09 ENCOUNTER — Encounter (HOSPITAL_COMMUNITY): Payer: Self-pay | Admitting: Psychiatry

## 2018-10-09 ENCOUNTER — Ambulatory Visit (HOSPITAL_COMMUNITY): Payer: 59 | Admitting: Psychiatry

## 2018-10-09 VITALS — BP 110/72 | HR 108 | Ht 63.0 in | Wt 115.6 lb

## 2018-10-09 DIAGNOSIS — F411 Generalized anxiety disorder: Secondary | ICD-10-CM

## 2018-10-09 DIAGNOSIS — F0630 Mood disorder due to known physiological condition, unspecified: Secondary | ICD-10-CM | POA: Diagnosis not present

## 2018-10-09 DIAGNOSIS — F331 Major depressive disorder, recurrent, moderate: Secondary | ICD-10-CM | POA: Diagnosis not present

## 2018-10-09 MED ORDER — FLUOXETINE HCL 40 MG PO CAPS: 40.0000 mg | ORAL_CAPSULE | Freq: Every day | ORAL | 2 refills | Status: DC

## 2018-10-09 MED ORDER — BUPROPION HCL ER (XL) 150 MG PO TB24: 150.0000 mg | ORAL_TABLET | Freq: Every day | ORAL | 2 refills | Status: DC

## 2018-10-09 MED ORDER — BUSPIRONE HCL 7.5 MG PO TABS: 7.5000 mg | ORAL_TABLET | Freq: Every day | ORAL | 0 refills | Status: DC
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
## 2018-11-01 ENCOUNTER — Other Ambulatory Visit (HOSPITAL_COMMUNITY): Payer: Self-pay | Admitting: Psychiatry

## 2018-12-11 ENCOUNTER — Ambulatory Visit (HOSPITAL_COMMUNITY): Payer: 59 | Admitting: Psychiatry

## 2019-01-08 ENCOUNTER — Other Ambulatory Visit: Payer: Self-pay

## 2019-01-08 ENCOUNTER — Ambulatory Visit (INDEPENDENT_AMBULATORY_CARE_PROVIDER_SITE_OTHER): Payer: 59 | Admitting: Psychiatry

## 2019-01-08 ENCOUNTER — Encounter (HOSPITAL_COMMUNITY): Payer: Self-pay | Admitting: Psychiatry

## 2019-01-08 VITALS — BP 108/80 | HR 100 | Ht 63.0 in | Wt 118.0 lb

## 2019-01-08 DIAGNOSIS — F3281 Premenstrual dysphoric disorder: Secondary | ICD-10-CM | POA: Diagnosis not present

## 2019-01-08 DIAGNOSIS — F411 Generalized anxiety disorder: Secondary | ICD-10-CM

## 2019-01-08 DIAGNOSIS — F331 Major depressive disorder, recurrent, moderate: Secondary | ICD-10-CM

## 2019-01-08 DIAGNOSIS — F0630 Mood disorder due to known physiological condition, unspecified: Secondary | ICD-10-CM | POA: Diagnosis not present

## 2019-01-08 MED ORDER — BUSPIRONE HCL 7.5 MG PO TABS: 7.5000 mg | ORAL_TABLET | Freq: Every day | ORAL | 0 refills | Status: DC

## 2019-01-08 MED ORDER — FLUOXETINE HCL 40 MG PO CAPS: 40.0000 mg | ORAL_CAPSULE | Freq: Every day | ORAL | 1 refills | Status: DC

## 2019-01-08 MED ORDER — BUPROPION HCL ER (XL) 150 MG PO TB24: 150.0000 mg | ORAL_TABLET | Freq: Every day | ORAL | 2 refills | Status: DC

## 2019-01-08 NOTE — Progress Notes (Signed)
[facility] Outpatient Follow up visit  Patient Identification: Beverly Anderson MRN:  [id number] Date of Evaluation:  01/08/2019 Referral Source: [place], [place] Chief Complaint:   Chief Complaint    Follow-up; Other     Visit Diagnosis:    ICD-10-CM   1. Mood disorder in conditions classified elsewhere F06.30   2. GAD (generalized anxiety disorder) F41.1   3. Moderate episode of recurrent major depressive disorder (HCC) F33.1   4. Premenstrual dysphoria F32.81    Diagnosis:   Patient Active Problem List   Diagnosis Date Noted  . Clinical depression [F32.9] 01/22/2016   History of Present Illness:  43  years old currently single Caucasian female lives by herself referred by primary care practice for evaluation and management of depression, anxiety and possible ADD  Returns for follow-up break-up from the ex-boyfriend is not a worry as of now he is not harassing.  She does have a new boyfriend who is good she goes to church with him and also go to the gym.  Some cyst found on the breast they will keep an eye otherwise tolerating medication BuSpar helps anxiety as well she takes it as needed. History of brain tumor and surgery. Supportive dad  Depression : fluctuates  Modifying factors : animals, current boy friend Job stress along with school upset her     denies drug use    Past Medical History:  Past Medical History:  Diagnosis Date  . Brain tumor (benign) ([place])   . Stroke [facility])    as a child    Past Surgical History:  Procedure Laterality Date  . CERVICAL ABLATION     Family History: History reviewed. No pertinent family history. Social History:   Social History   Socioeconomic History  . Marital status: Single    Spouse name: Not on file  . Number of children: Not on file  . Years of education: Not on file  . Highest education level: Not on file  Occupational History  . Not on file  Social Needs  . Financial resource strain: Not on file  . Food insecurity:     Worry: Not on file    Inability: Not on file  . Transportation needs:    Medical: Not on file    Non-medical: Not on file  Tobacco Use  . Smoking status: Never Smoker  . Smokeless tobacco: Never Used  Substance and Sexual Activity  . Alcohol use: No    Alcohol/week: 0.0 standard drinks  . Drug use: No  . Sexual activity: Yes    Partners: Male  Lifestyle  . Physical activity:    Days per week: Not on file    Minutes per session: Not on file  . Stress: Not on file  Relationships  . Social connections:    Talks on phone: Not on file    Gets together: Not on file    Attends religious service: Not on file    Active member of club or organization: Not on file    Attends meetings of clubs or organizations: Not on file    Relationship status: Not on file  Other Topics Concern  . Not on file  Social History Narrative  . Not on file        Psychiatric Specialty Exam: Medication Refill  Pertinent negatives include no fever or vomiting.    Review of Systems  Constitutional: Positive for malaise/fatigue. Negative for fever.  Cardiovascular: Negative for palpitations.  Gastrointestinal: Negative for vomiting.  Skin: Negative  for itching.  Neurological: Negative for tremors.  Psychiatric/Behavioral: Negative for substance abuse and suicidal ideas. The patient is nervous/anxious.     Blood pressure 108/80, pulse 100, height 5\' 3"  (1.6 m), weight 118 lb (53.5 kg).Body mass index is 20.9 kg/m.  General Appearance: Casual  Eye Contact:  Fair  Speech:  Normal Rate  Volume:  Normal  Mood: better  Affect:  congruent  Thought Process:  Coherent  Orientation:  Full (Time, Place, and Person)  Thought Content:  Rumination  Suicidal Thoughts:  No  Homicidal Thoughts:  No  Memory:  Immediate;   Fair Recent;   Fair  Judgement:  Fair  Insight:  Shallow  Psychomotor Activity:  Normal  Concentration:  Fair  Recall:  [place]: Fair  Akathisia:   Negative  Handed:  Right  AIMS (if indicated):    Assets:  Desire for Improvement Social Support  ADL's:  Intact  Cognition: WNL  Sleep:  fair    Allergies:   Allergies  Allergen Reactions  . Cefaclor Hives  . Erythromycin Hives  . Shellfish-Derived Products Hives   Current Medications: Current Outpatient Medications  Medication Sig Dispense Refill  . buPROPion (WELLBUTRIN XL) 150 MG 24 hr tablet Take 1 tablet (150 mg total) by mouth daily. 30 tablet 2  . busPIRone (BUSPAR) 7.5 MG tablet Take 1 tablet (7.5 mg total) by mouth daily. 30 tablet 0  . etonogestrel-ethinyl estradiol (NUVARING) 0.12-0.015 MG/24HR vaginal ring Place 1 ring vaginally every month.    Marland Kitchen FLUoxetine (PROZAC) 40 MG capsule Take 1 capsule (40 mg total) by mouth daily. 30 capsule 1  . loratadine (CLARITIN) 10 MG tablet Take 10 mg by mouth daily.     No current facility-administered medications for this visit.       Treatment Plan Summary: Medication management and Plan as follows   1. MDD recurrent: doing fair. Continue wellbutrin, prozac 2. GAD improved. Continue buspar prn, prozac 3. Pre menstrual dysphoria;not worse. cotninue prozac   Questions addressed fu3-30m or earlier if needed  [name] 2/7/20209:19 AM

## 2019-02-07 ENCOUNTER — Other Ambulatory Visit (HOSPITAL_COMMUNITY): Payer: Self-pay | Admitting: Psychiatry

## 2019-05-12 ENCOUNTER — Ambulatory Visit (INDEPENDENT_AMBULATORY_CARE_PROVIDER_SITE_OTHER): Payer: 59 | Admitting: Psychiatry

## 2019-05-12 ENCOUNTER — Encounter (HOSPITAL_COMMUNITY): Payer: Self-pay | Admitting: Psychiatry

## 2019-05-12 DIAGNOSIS — F411 Generalized anxiety disorder: Secondary | ICD-10-CM | POA: Diagnosis not present

## 2019-05-12 DIAGNOSIS — F331 Major depressive disorder, recurrent, moderate: Secondary | ICD-10-CM | POA: Diagnosis not present

## 2019-05-12 DIAGNOSIS — F0630 Mood disorder due to known physiological condition, unspecified: Secondary | ICD-10-CM | POA: Diagnosis not present

## 2019-05-12 MED ORDER — FLUOXETINE HCL 40 MG PO CAPS: 40.0000 mg | ORAL_CAPSULE | Freq: Every day | ORAL | 2 refills | Status: DC

## 2019-05-12 MED ORDER — BUPROPION HCL ER (XL) 150 MG PO TB24: 150.0000 mg | ORAL_TABLET | Freq: Every day | ORAL | 2 refills | Status: DC

## 2019-05-12 NOTE — Progress Notes (Signed)
[facility] Outpatient Follow up visit  Patient Identification: Beverly Anderson MRN:  [id number] Date of Evaluation:  05/12/2019 Referral Source: [place], [place] Chief Complaint:   depression follow up  Visit Diagnosis:  No diagnosis found. Diagnosis:   Patient Active Problem List   Diagnosis Date Noted  . Clinical depression [F32.9] 01/22/2016   History of Present Illness:  43  years old currently single Caucasian female lives by herself referred by primary care practice for evaluation and management of depression, anxiety and possible ADD  I connected with Daje S Webb on 05/12/19 at 10:00 AM EDT by telephone and verified that I am speaking with the correct person using two identifiers.   I discussed the limitations, risks, security and privacy concerns of performing an evaluation and management service by telephone and the availability of in person appointments. I also discussed with the patient that there may be a patient responsible charge related to this service. The patient expressed understanding and agreed to proceed.  Doing fair, working and tolerating meds. Supportive family History of brain tumor and surgery. Supportive dad  Depression : fluctuates but not worse  Modifying factors : animals, current boy friend      denies drug use    Past Medical History:  Past Medical History:  Diagnosis Date  . Brain tumor (benign) ([place])   . Stroke [facility])    as a child    Past Surgical History:  Procedure Laterality Date  . CERVICAL ABLATION     Family History: No family history on file. Social History:   Social History   Socioeconomic History  . Marital status: Single    Spouse name: Not on file  . Number of children: Not on file  . Years of education: Not on file  . Highest education level: Not on file  Occupational History  . Not on file  Social Needs  . Financial resource strain: Not on file  . Food insecurity:    Worry: Not on file    Inability: Not on file  .  Transportation needs:    Medical: Not on file    Non-medical: Not on file  Tobacco Use  . Smoking status: Never Smoker  . Smokeless tobacco: Never Used  Substance and Sexual Activity  . Alcohol use: No    Alcohol/week: 0.0 standard drinks  . Drug use: No  . Sexual activity: Yes    Partners: Male  Lifestyle  . Physical activity:    Days per week: Not on file    Minutes per session: Not on file  . Stress: Not on file  Relationships  . Social connections:    Talks on phone: Not on file    Gets together: Not on file    Attends religious service: Not on file    Active member of club or organization: Not on file    Attends meetings of clubs or organizations: Not on file    Relationship status: Not on file  Other Topics Concern  . Not on file  Social History Narrative  . Not on file        Psychiatric Specialty Exam: Medication Refill  Pertinent negatives include no fever or vomiting.    Review of Systems  Constitutional: Positive for malaise/fatigue. Negative for fever.  Cardiovascular: Negative for palpitations.  Gastrointestinal: Negative for vomiting.  Skin: Negative for itching.  Neurological: Negative for tremors.  Psychiatric/Behavioral: Negative for substance abuse and suicidal ideas. The patient is nervous/anxious.     There were  no vitals taken for this visit.There is no height or weight on file to calculate BMI.  General Appearance:   Eye Contact:   Speech:  Normal Rate  Volume:  Normal  Mood: fair  Affect:  congruent  Thought Process:  Coherent  Orientation:  Full (Time, Place, and Person)  Thought Content:  Rumination  Suicidal Thoughts:  No  Homicidal Thoughts:  No  Memory:  Immediate;   Fair Recent;   Fair  Judgement:  Fair  Insight:  Shallow  Psychomotor Activity:  Normal  Concentration:  Fair  Recall:  [place]: Fair  Akathisia:  Negative  Handed:  Right  AIMS (if indicated):    Assets:  Desire for  Improvement Social Support  ADL's:  Intact  Cognition: WNL  Sleep:  fair    Allergies:   Allergies  Allergen Reactions  . Cefaclor Hives  . Erythromycin Hives  . Shellfish-Derived Products Hives   Current Medications: Current Outpatient Medications  Medication Sig Dispense Refill  . buPROPion (WELLBUTRIN XL) 150 MG 24 hr tablet Take 1 tablet (150 mg total) by mouth daily. 30 tablet 2  . busPIRone (BUSPAR) 7.5 MG tablet TAKE 1 TABLET BY MOUTH EVERY DAY 30 tablet 2  . etonogestrel-ethinyl estradiol (NUVARING) 0.12-0.015 MG/24HR vaginal ring Place 1 ring vaginally every month.    Marland Kitchen FLUoxetine (PROZAC) 40 MG capsule Take 1 capsule (40 mg total) by mouth daily. 30 capsule 2  . loratadine (CLARITIN) 10 MG tablet Take 10 mg by mouth daily.     No current facility-administered medications for this visit.       Treatment Plan Summary: Medication management and Plan as follows   1. MDD recurrent: doing fair, continue prozac and wellbutrin 2. GAD improved. Continue buspar prn, prozac 3. Pre menstrual dysphoria;not worse. cotninue prozac  I discussed the assessment and treatment plan with the patient. The patient was provided an opportunity to ask questions and all were answered. The patient agreed with the plan and demonstrated an understanding of the instructions.   The patient was advised to call back or seek an in-person evaluation if the symptoms worsen or if the condition fails to improve as anticipated.  Fu 1m. Or earlier if needed  [name] 6/10/202010:05 AM

## 2019-09-17 ENCOUNTER — Other Ambulatory Visit (HOSPITAL_COMMUNITY): Payer: Self-pay | Admitting: Psychiatry

## 2019-10-06 ENCOUNTER — Ambulatory Visit (INDEPENDENT_AMBULATORY_CARE_PROVIDER_SITE_OTHER): Payer: 59 | Admitting: Psychiatry

## 2019-10-06 ENCOUNTER — Encounter (HOSPITAL_COMMUNITY): Payer: Self-pay | Admitting: Psychiatry

## 2019-10-06 ENCOUNTER — Other Ambulatory Visit: Payer: Self-pay

## 2019-10-06 DIAGNOSIS — F331 Major depressive disorder, recurrent, moderate: Secondary | ICD-10-CM

## 2019-10-06 DIAGNOSIS — F411 Generalized anxiety disorder: Secondary | ICD-10-CM | POA: Diagnosis not present

## 2019-10-06 DIAGNOSIS — F0630 Mood disorder due to known physiological condition, unspecified: Secondary | ICD-10-CM

## 2019-10-06 MED ORDER — BUPROPION HCL ER (XL) 150 MG PO TB24: 150.0000 mg | ORAL_TABLET | Freq: Every day | ORAL | 2 refills | Status: DC

## 2019-10-06 NOTE — Progress Notes (Signed)
[facility] Outpatient Follow up visit  Patient Identification: Beverly Anderson MRN:  [id number] Date of Evaluation:  10/06/2019 Referral Source: [place], [place] Chief Complaint:   depression follow up  Visit Diagnosis:    ICD-10-CM   1. Mood disorder in conditions classified elsewhere  F06.30   2. GAD (generalized anxiety disorder)  F41.1   3. Moderate episode of recurrent major depressive disorder (HCC)  F33.1    Diagnosis:   Patient Active Problem List   Diagnosis Date Noted  . Clinical depression [F32.9] 01/22/2016   History of Present Illness:  43  years old currently single Caucasian female lives by herself referred by primary care practice for evaluation and management of depression, anxiety and possible ADD   I connected with Riana S Dibble on 10/06/19 at  8:45 AM EST by telephone and verified that I am speaking with the correct person using two identifiers.      I discussed the limitations, risks, security and privacy concerns of performing an evaluation and management service by telephone and the availability of in person appointments. I also discussed with the patient that there may be a patient responsible charge related to this service. The patient expressed understanding and agreed to proceed.  Doing fair, less anxious and not taking buspar regularly Working from home History of brain tumor and surgery. Supportive dad  Depression : fluctuates but not worse  Modifying factors : animals, current boy friend      denies drug use    Past Medical History:  Past Medical History:  Diagnosis Date  . Brain tumor (benign) ([place])   . Stroke [facility])    as a child    Past Surgical History:  Procedure Laterality Date  . CERVICAL ABLATION     Family History: No family history on file. Social History:   Social History   Socioeconomic History  . Marital status: Single    Spouse name: Not on file  . Number of children: Not on file  . Years of education: Not on file  .  Highest education level: Not on file  Occupational History  . Not on file  Social Needs  . Financial resource strain: Not on file  . Food insecurity    Worry: Not on file    Inability: Not on file  . Transportation needs    Medical: Not on file    Non-medical: Not on file  Tobacco Use  . Smoking status: Never Smoker  . Smokeless tobacco: Never Used  Substance and Sexual Activity  . Alcohol use: No    Alcohol/week: 0.0 standard drinks  . Drug use: No  . Sexual activity: Yes    Partners: Male  Lifestyle  . Physical activity    Days per week: Not on file    Minutes per session: Not on file  . Stress: Not on file  Relationships  . Social Herbalist on phone: Not on file    Gets together: Not on file    Attends religious service: Not on file    Active member of club or organization: Not on file    Attends meetings of clubs or organizations: Not on file    Relationship status: Not on file  Other Topics Concern  . Not on file  Social History Narrative  . Not on file        Psychiatric Specialty Exam: Medication Refill    Review of Systems  Constitutional: Positive for malaise/fatigue.  Cardiovascular: Negative for palpitations.  Skin: Negative for itching.  Neurological: Negative for tremors.  Psychiatric/Behavioral: Negative for substance abuse and suicidal ideas. The patient is not nervous/anxious.     There were no vitals taken for this visit.There is no height or weight on file to calculate BMI.  General Appearance:   Eye Contact:   Speech:  Normal Rate  Volume:  Normal  Mood: fair  Affect:  congruent  Thought Process:  Coherent  Orientation:  Full (Time, Place, and Person)  Thought Content:  Rumination  Suicidal Thoughts:  No  Homicidal Thoughts:  No  Memory:  Immediate;   Fair Recent;   Fair  Judgement:  Fair  Insight:  Shallow  Psychomotor Activity:  Normal  Concentration:  Fair  Recall:  [place]: Fair   Akathisia:  Negative  Handed:  Right  AIMS (if indicated):    Assets:  Desire for Improvement Social Support  ADL's:  Intact  Cognition: WNL  Sleep:  fair    Allergies:   Allergies  Allergen Reactions  . Cefaclor Hives  . Erythromycin Hives  . Shellfish-Derived Products Hives   Current Medications: Current Outpatient Medications  Medication Sig Dispense Refill  . buPROPion (WELLBUTRIN XL) 150 MG 24 hr tablet Take 1 tablet (150 mg total) by mouth daily. 30 tablet 2  . busPIRone (BUSPAR) 7.5 MG tablet TAKE 1 TABLET BY MOUTH EVERY DAY 30 tablet 2  . etonogestrel-ethinyl estradiol (NUVARING) 0.12-0.015 MG/24HR vaginal ring Place 1 ring vaginally every month.    Marland Kitchen FLUoxetine (PROZAC) 40 MG capsule TAKE 1 CAPSULE BY MOUTH EVERY DAY 90 capsule 0  . loratadine (CLARITIN) 10 MG tablet Take 10 mg by mouth daily.     No current facility-administered medications for this visit.       Treatment Plan Summary: Medication management and Plan as follows   1. MDD recurrent: doing fair, continue prozac and wellbutrin 2. GAD improved. Continue prozac, seldom takes buspar now  3. Pre menstrual dysphoria;not worse. cotninue prozac  I discussed the assessment and treatment plan with the patient. The patient was provided an opportunity to ask questions and all were answered. The patient agreed with the plan and demonstrated an understanding of the instructions.   The patient was advised to call back or seek an in-person evaluation if the symptoms worsen or if the condition fails to improve as anticipated.  Fu 60m. Or earlier if needed  [name] 11/4/20208:53 AM

## 2019-11-16 IMAGING — CT CT HEAD W/O CM
3 series · 15 of 47 positions shown, 18 images · non-contrast
Comparison: None.

CLINICAL DATA: 42-year-old female status post fall at work 2 days
ago with persistent headache and drowsiness. History of brain tumor
and cerebrovascular accident at age 14.

EXAM:
CT HEAD WITHOUT CONTRAST
TECHNIQUE: Contiguous axial images were obtained from the base of the skull
through the vertex without intravenous contrast.

[Series 2: head wo · axial · 0.44mm/px · z∈[-183,-43]mm · 9 of 34 slices shown, 12 images]
[im 3/34  brain]
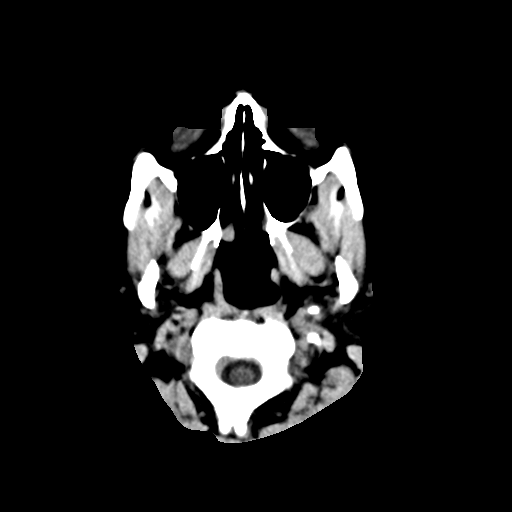
[im 3/34  bone]
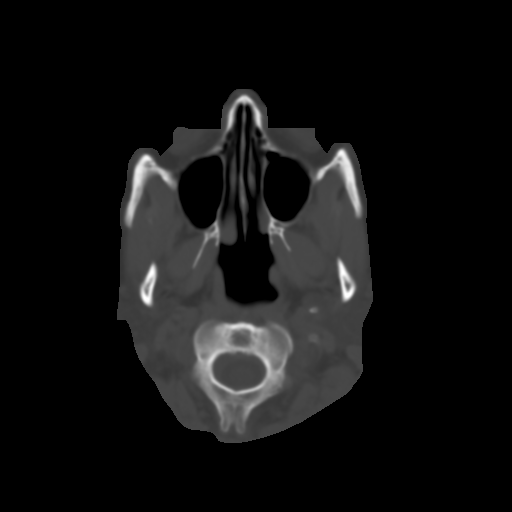
[im 6/34  brain]
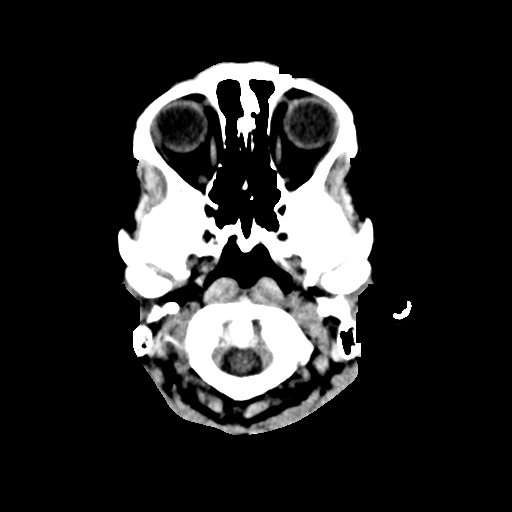
[im 10/34  brain]
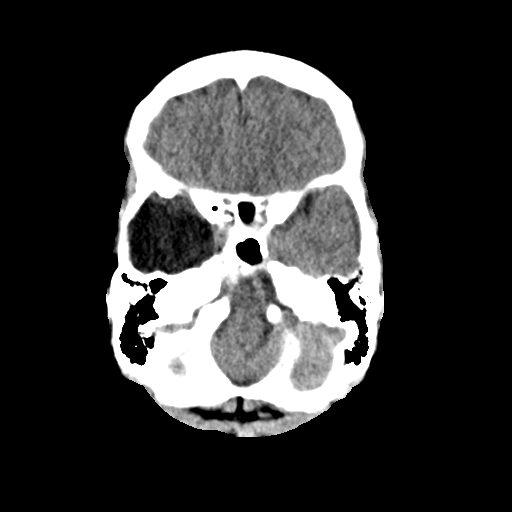
[im 13/34  brain]
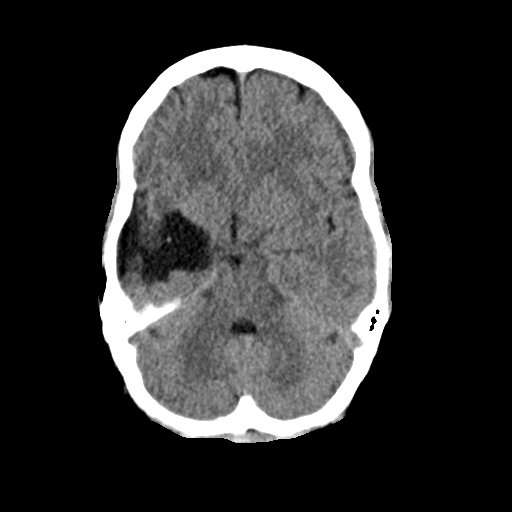
[im 18/34  brain]
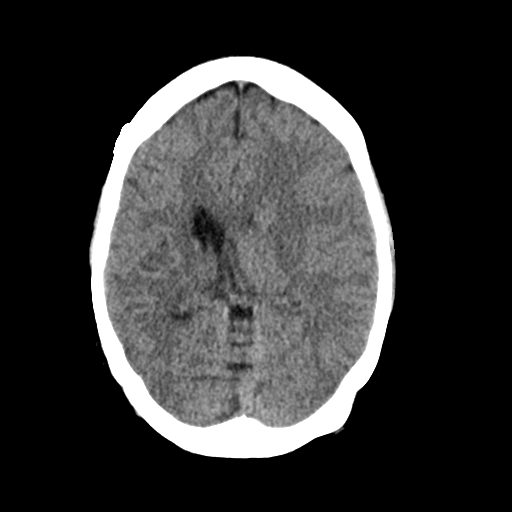
[im 18/34  bone]
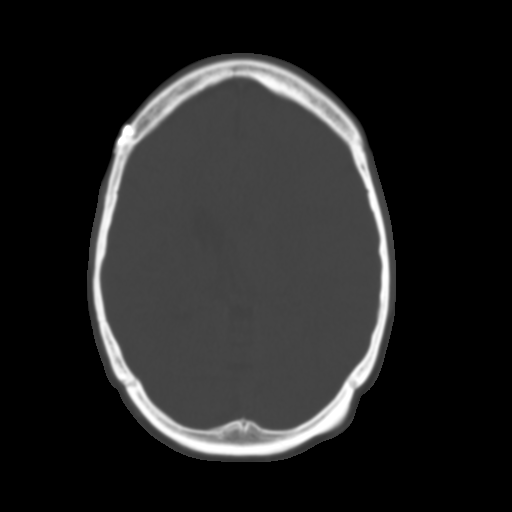
[im 21/34  brain]
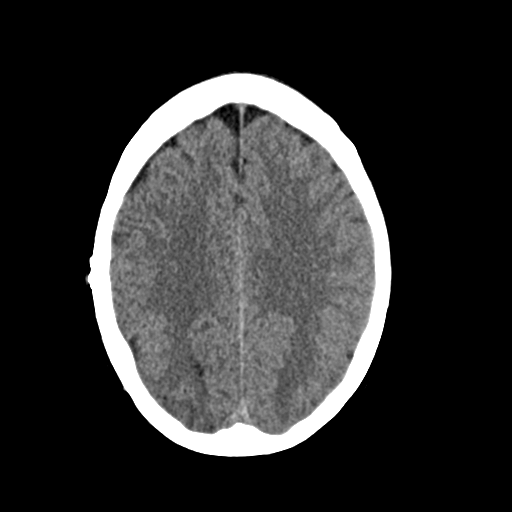
[im 24/34  brain]
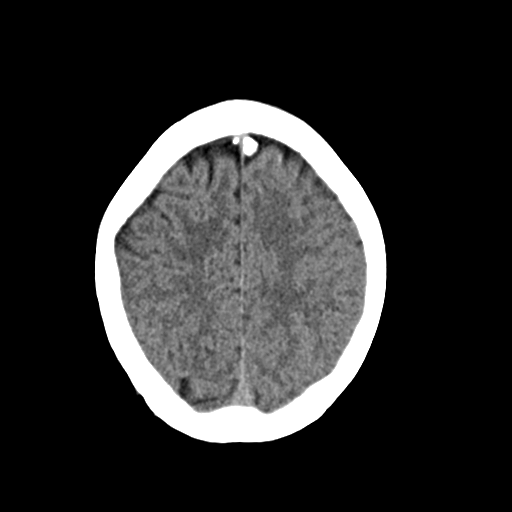
[im 28/34  brain]
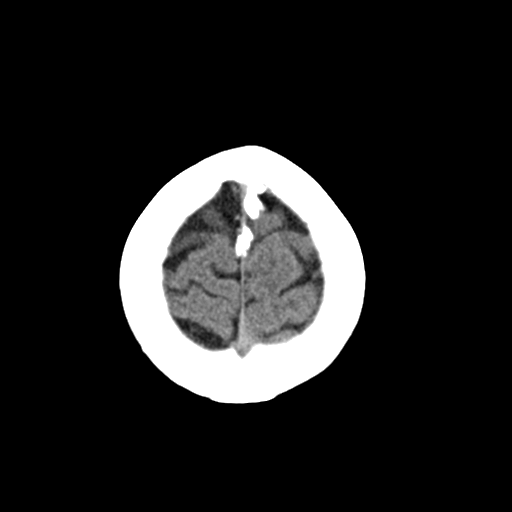
[im 31/34  brain]
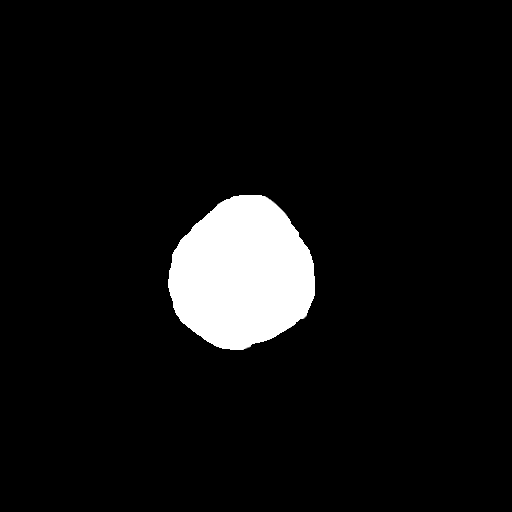
[im 31/34  bone]
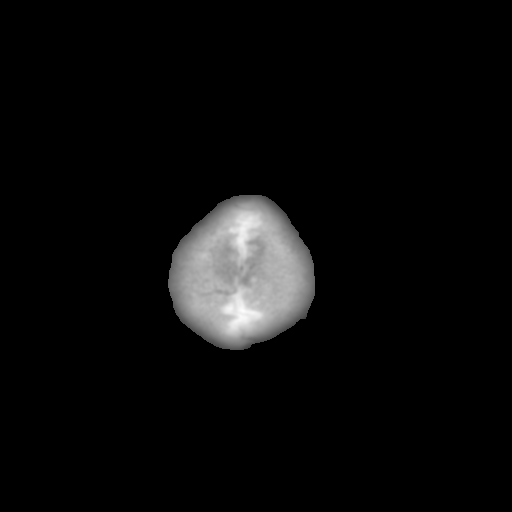

[Series 4: coronal soft · coronal · 0.32mm/px · 3 of 65 slices shown]
[im 22/65  brain]
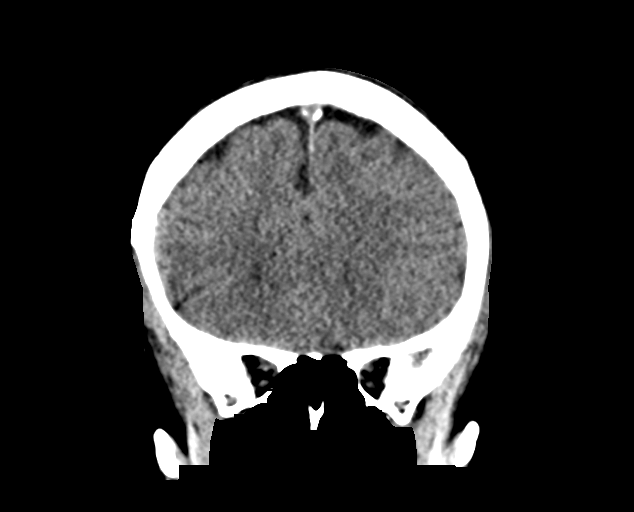
[im 29/65  brain]
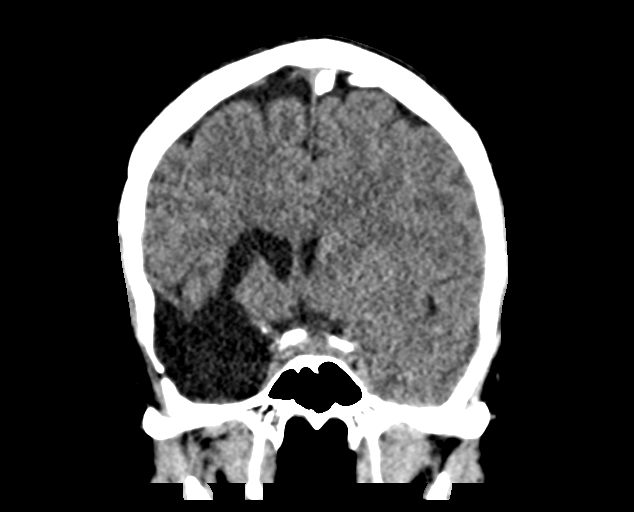
[im 36/65  brain]
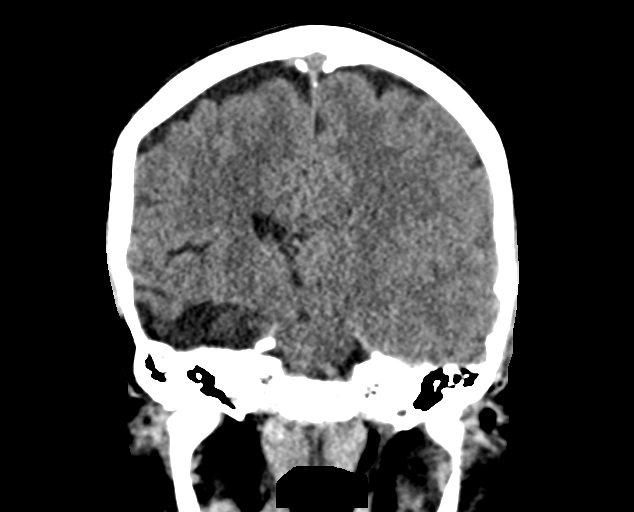

[Series 5: sag soft · sagittal · 0.32mm/px · 3 of 50 slices shown]
[im 17/50  brain]
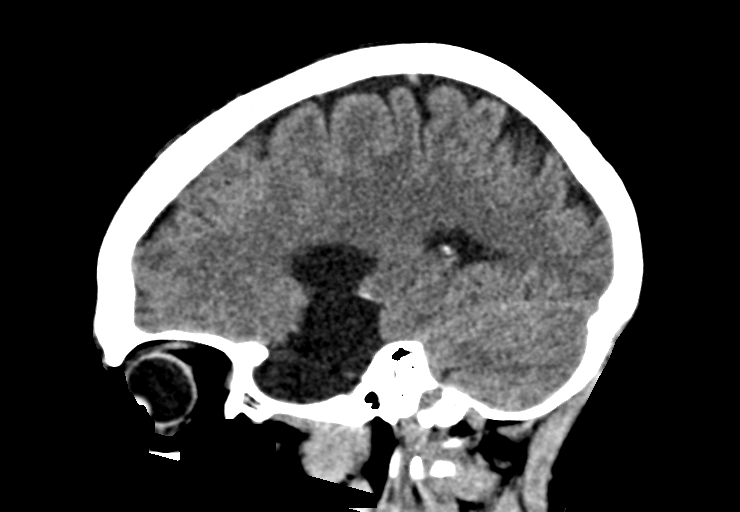
[im 25/50  brain]
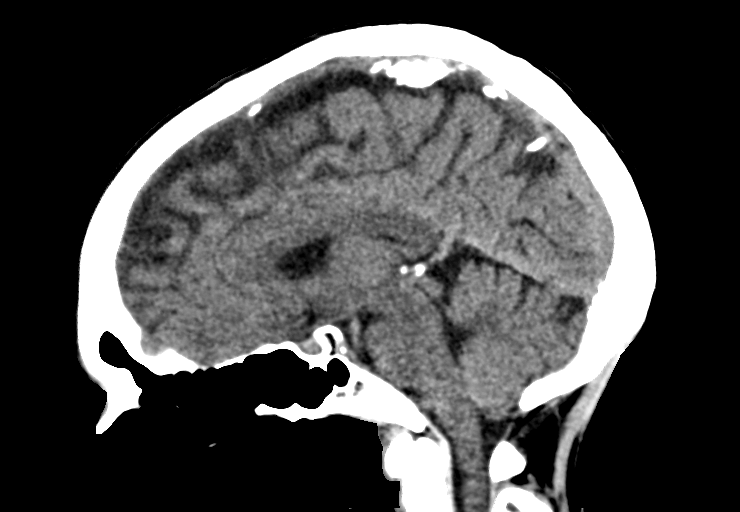
[im 33/50  brain]
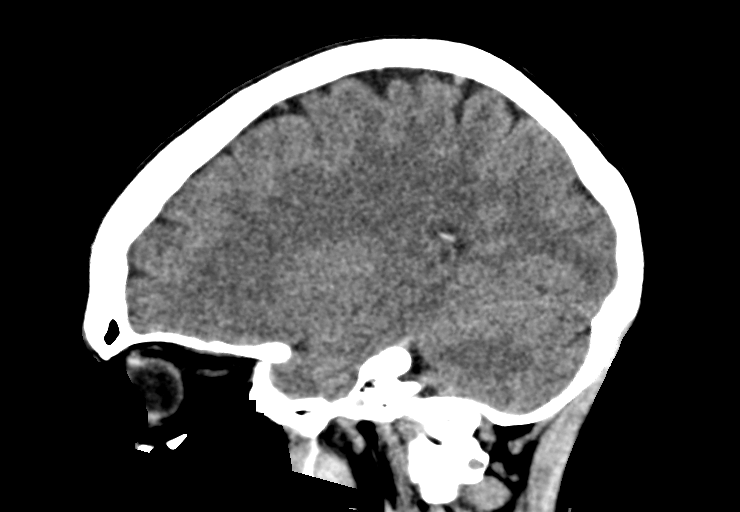

[15 of 47 positions shown; findings below may reference images not displayed]

FINDINGS: Brain: No evidence of acute infarction, hemorrhage, hydrocephalus,
extra-axial collection or mass lesion/mass effect. Focal
encephalomalacia involving the right temporal lobe and right putamen
consistent with the clinical history of resected brain tumor and
ischemic infarct. No evidence of complication.

Vascular: No hyperdense vessel or unexpected calcification.

Skull: Normal. Negative for fracture or focal lesion.

Sinuses/Orbits: No acute finding.

Other: None.
IMPRESSION: 1. No acute intracranial abnormality.
2. Surgical changes of prior right temporal lobe resection and
likely remote ischemic insult involving the right putamen.

## 2019-12-02 ENCOUNTER — Other Ambulatory Visit (HOSPITAL_COMMUNITY): Payer: Self-pay | Admitting: Psychiatry

## 2020-02-02 ENCOUNTER — Other Ambulatory Visit (HOSPITAL_COMMUNITY): Payer: Self-pay

## 2020-02-02 MED ORDER — FLUOXETINE HCL 40 MG PO CAPS: ORAL_CAPSULE | ORAL | 0 refills | Status: DC

## 2020-02-09 ENCOUNTER — Ambulatory Visit (INDEPENDENT_AMBULATORY_CARE_PROVIDER_SITE_OTHER): Payer: 59 | Admitting: Psychiatry

## 2020-02-09 ENCOUNTER — Encounter (HOSPITAL_COMMUNITY): Payer: Self-pay | Admitting: Psychiatry

## 2020-02-09 DIAGNOSIS — F3281 Premenstrual dysphoric disorder: Secondary | ICD-10-CM | POA: Diagnosis not present

## 2020-02-09 DIAGNOSIS — F339 Major depressive disorder, recurrent, unspecified: Secondary | ICD-10-CM | POA: Diagnosis not present

## 2020-02-09 DIAGNOSIS — F411 Generalized anxiety disorder: Secondary | ICD-10-CM | POA: Diagnosis not present

## 2020-02-09 DIAGNOSIS — F0630 Mood disorder due to known physiological condition, unspecified: Secondary | ICD-10-CM

## 2020-02-09 MED ORDER — BUPROPION HCL ER (XL) 150 MG PO TB24: 150.0000 mg | ORAL_TABLET | Freq: Every day | ORAL | 0 refills | Status: DC

## 2020-02-09 NOTE — Progress Notes (Signed)
[facility] Outpatient Follow up visit  Patient Identification: Beverly Anderson MRN:  [id number] Date of Evaluation:  02/09/2020 Referral Source: [place], [place] Chief Complaint:   depression follow up  Visit Diagnosis:    ICD-10-CM   1. Mood disorder in conditions classified elsewhere  F06.30   2. GAD (generalized anxiety disorder)  F41.1   3. Premenstrual dysphoria  F32.81    Diagnosis:   Patient Active Problem List   Diagnosis Date Noted  . Clinical depression [F32.9] 01/22/2016   History of Present Illness:  44  years old currently single Caucasian female lives by herself referred by primary care practice for evaluation and management of depression, anxiety and possible ADD    I connected with Beverly Anderson on 02/09/20 at  9:00 AM EST by telephone and verified that I am speaking with the correct person using two identifiers.       I discussed the limitations, risks, security and privacy concerns of performing an evaluation and management service by telephone and the availability of in person appointments. I also discussed with the patient that there may be a patient responsible charge related to this service. The patient expressed understanding and agreed to proceed.  Works from home Depression manageable on wellbutrin, prozac History of brain tumor and surgery 1992   Supportive dad  Depression : fluctuates but not worse  Modifying factors : animals,  current boy friend  Duration 10 years plus Severity improved or baseline    denies drug use    Past Medical History:  Past Medical History:  Diagnosis Date  . Brain tumor (benign) ([place])   . Stroke [facility])    as a child    Past Surgical History:  Procedure Laterality Date  . CERVICAL ABLATION     Family History: History reviewed. No pertinent family history. Social History:   Social History   Socioeconomic History  . Marital status: Single    Spouse name: Not on file  . Number of children: Not on file  . Years  of education: Not on file  . Highest education level: Not on file  Occupational History  . Not on file  Tobacco Use  . Smoking status: Never Smoker  . Smokeless tobacco: Never Used  Substance and Sexual Activity  . Alcohol use: No    Alcohol/week: 0.0 standard drinks  . Drug use: No  . Sexual activity: Yes    Partners: Male  Other Topics Concern  . Not on file  Social History Narrative  . Not on file   Social Determinants of Health   Financial Resource Strain:   . Difficulty of Paying Living Expenses: Not on file  Food Insecurity:   . Worried About Charity fundraiser in the Last Year: Not on file  . Ran Out of Food in the Last Year: Not on file  Transportation Needs:   . Lack of Transportation (Medical): Not on file  . Lack of Transportation (Non-Medical): Not on file  Physical Activity:   . Days of Exercise per Week: Not on file  . Minutes of Exercise per Session: Not on file  Stress:   . Feeling of Stress : Not on file  Social Connections:   . Frequency of Communication with Friends and Family: Not on file  . Frequency of Social Gatherings with Friends and Family: Not on file  . Attends Religious Services: Not on file  . Active Member of Clubs or Organizations: Not on file  . Attends Club or  Organization Meetings: Not on file  . Marital Status: Not on file        Psychiatric Specialty Exam: Medication Refill    Review of Systems  Constitutional: Positive for malaise/fatigue.  Cardiovascular: Negative for palpitations.  Skin: Negative for itching.  Neurological: Negative for tremors.  Psychiatric/Behavioral: Negative for substance abuse and suicidal ideas. The patient is not nervous/anxious.     There were no vitals taken for this visit.There is no height or weight on file to calculate BMI.  General Appearance:   Eye Contact:   Speech:  Normal Rate  Volume:  Normal  Mood: fair  Affect:  congruent  Thought Process:  Coherent  Orientation:  Full (Time,  Place, and Person)  Thought Content:  Rumination  Suicidal Thoughts:  No  Homicidal Thoughts:  No  Memory:  Immediate;   Fair Recent;   Fair  Judgement:  Fair  Insight:  Shallow  Psychomotor Activity:  Normal  Concentration:  Fair  Recall:  [employer]: Fair  Akathisia:  Negative  Handed:  Right  AIMS (if indicated):    Assets:  Desire for Improvement Social Support  ADL's:  Intact  Cognition: WNL  Sleep:  fair    Allergies:   Allergies  Allergen Reactions  . Cefaclor Hives  . Erythromycin Hives  . Shellfish-Derived Products Hives   Current Medications: Current Outpatient Medications  Medication Sig Dispense Refill  . buPROPion (WELLBUTRIN XL) 150 MG 24 hr tablet Take 1 tablet (150 mg total) by mouth daily. 90 tablet 0  . busPIRone (BUSPAR) 7.5 MG tablet TAKE 1 TABLET BY MOUTH EVERY DAY 30 tablet 2  . etonogestrel-ethinyl estradiol (NUVARING) 0.12-0.015 MG/24HR vaginal ring Place 1 ring vaginally every month.    Marland Kitchen FLUoxetine (PROZAC) 40 MG capsule TAKE 1 CAPSULE BY MOUTH EVERY DAY 90 capsule 0  . loratadine (CLARITIN) 10 MG tablet Take 10 mg by mouth daily.     No current facility-administered medications for this visit.      Treatment Plan Summary: Medication management and Plan as follows   1. MDD recurrent: manageable, continue wellbutrin 2. GAD better, continue buspar, prozac 3. Pre menstrual dysphoria;not worse, continue prozac  I discussed the assessment and treatment plan with the patient. The patient was provided an opportunity to ask questions and all were answered. The patient agreed with the plan and demonstrated an understanding of the instructions.   The patient was advised to call back or seek an in-person evaluation if the symptoms worsen or if the condition fails to improve as anticipated. Non face to face time spent 24min Fu 68m. Or earlier if needed  Beverly Anderson 3/10/20219:07 AM

## 2020-03-07 ENCOUNTER — Other Ambulatory Visit (HOSPITAL_COMMUNITY): Payer: Self-pay | Admitting: Psychiatry

## 2020-05-02 ENCOUNTER — Other Ambulatory Visit (HOSPITAL_COMMUNITY): Payer: Self-pay | Admitting: Psychiatry

## 2020-05-09 ENCOUNTER — Other Ambulatory Visit (HOSPITAL_COMMUNITY): Payer: Self-pay | Admitting: Psychiatry

## 2020-05-11 ENCOUNTER — Other Ambulatory Visit (HOSPITAL_COMMUNITY): Payer: Self-pay

## 2020-05-11 MED ORDER — BUPROPION HCL ER (XL) 150 MG PO TB24: 150.0000 mg | ORAL_TABLET | Freq: Every day | ORAL | 0 refills | Status: DC

## 2020-06-12 ENCOUNTER — Telehealth (HOSPITAL_COMMUNITY): Payer: 59 | Admitting: Psychiatry

## 2020-06-13 ENCOUNTER — Telehealth (HOSPITAL_COMMUNITY): Payer: 59 | Admitting: Psychiatry

## 2020-06-21 ENCOUNTER — Encounter (HOSPITAL_COMMUNITY): Payer: Self-pay | Admitting: Psychiatry

## 2020-06-21 ENCOUNTER — Telehealth (INDEPENDENT_AMBULATORY_CARE_PROVIDER_SITE_OTHER): Payer: 59 | Admitting: Psychiatry

## 2020-06-21 DIAGNOSIS — F0630 Mood disorder due to known physiological condition, unspecified: Secondary | ICD-10-CM

## 2020-06-21 DIAGNOSIS — F411 Generalized anxiety disorder: Secondary | ICD-10-CM

## 2020-06-21 MED ORDER — FLUOXETINE HCL 40 MG PO CAPS: 40.0000 mg | ORAL_CAPSULE | Freq: Every day | ORAL | 0 refills | Status: DC

## 2020-06-21 MED ORDER — BUSPIRONE HCL 7.5 MG PO TABS: 7.5000 mg | ORAL_TABLET | Freq: Every day | ORAL | 1 refills | Status: DC

## 2020-06-21 MED ORDER — BUPROPION HCL ER (XL) 150 MG PO TB24: 150.0000 mg | ORAL_TABLET | Freq: Every day | ORAL | 0 refills | Status: DC

## 2020-06-21 NOTE — Progress Notes (Signed)
[facility] Outpatient Follow up visit  Patient Identification: Beverly Anderson MRN:  [id number] Date of Evaluation:  06/21/2020 Referral Source: [place], [place] Chief Complaint:   depression follow up  Visit Diagnosis:    ICD-10-CM   1. Mood disorder in conditions classified elsewhere  F06.30   2. GAD (generalized anxiety disorder)  F41.1    Diagnosis:   Patient Active Problem List   Diagnosis Date Noted  . Clinical depression [F32.9] 01/22/2016   History of Present Illness:  44  years old currently single Caucasian female lives by herself referred by primary care practice for evaluation and management of depression, anxiety and possible ADD     I connected with Ajayla S Droge on 06/21/20 at  8:30 AM EDT by telephone and verified that I am speaking with the correct person using two identifiers.   I discussed the limitations, risks, security and privacy concerns of performing an evaluation and management service by telephone and the availability of in person appointments. I also discussed with the patient that there may be a patient responsible charge related to this service. The patient expressed understanding and agreed to proceed.  Patient location home Provider location home  Works from home, less stressful. Tolerating meds History of brain tumor and surgery 1992  Dog has to go cardiology Supportive dad  Depression : fluctuates but not worse  Modifying factors : animals,  current boy friend  Duration 10 years plus Severity improved or baseline    denies drug use    Past Medical History:  Past Medical History:  Diagnosis Date  . Brain tumor (benign) ([place])   . Stroke [facility])    as a child    Past Surgical History:  Procedure Laterality Date  . CERVICAL ABLATION     Family History: History reviewed. No pertinent family history. Social History:   Social History   Socioeconomic History  . Marital status: Single    Spouse name: Not on file  . Number of children: Not  on file  . Years of education: Not on file  . Highest education level: Not on file  Occupational History  . Not on file  Tobacco Use  . Smoking status: Never Smoker  . Smokeless tobacco: Never Used  Vaping Use  . Vaping Use: Never used  Substance and Sexual Activity  . Alcohol use: No    Alcohol/week: 0.0 standard drinks  . Drug use: No  . Sexual activity: Yes    Partners: Male  Other Topics Concern  . Not on file  Social History Narrative  . Not on file   Social Determinants of Health   Financial Resource Strain:   . Difficulty of Paying Living Expenses:   Food Insecurity:   . Worried About Charity fundraiser in the Last Year:   . Arboriculturist in the Last Year:   Transportation Needs:   . Film/video editor (Medical):   Marland Kitchen Lack of Transportation (Non-Medical):   Physical Activity:   . Days of Exercise per Week:   . Minutes of Exercise per Session:   Stress:   . Feeling of Stress :   Social Connections:   . Frequency of Communication with Friends and Family:   . Frequency of Social Gatherings with Friends and Family:   . Attends Religious Services:   . Active Member of Clubs or Organizations:   . Attends Archivist Meetings:   Marland Kitchen Marital Status:  Psychiatric Specialty Exam: Medication Refill    Review of Systems  Constitutional: Positive for malaise/fatigue.  Cardiovascular: Negative for palpitations.  Skin: Negative for itching.  Neurological: Negative for tremors.  Psychiatric/Behavioral: Negative for substance abuse and suicidal ideas. The patient is not nervous/anxious.     There were no vitals taken for this visit.There is no height or weight on file to calculate BMI.  General Appearance:   Eye Contact:   Speech:  Normal Rate  Volume:  Normal  Mood: fair  Affect:  congruent  Thought Process:  Coherent  Orientation:  Full (Time, Place, and Person)  Thought Content:  Rumination  Suicidal Thoughts:  No  Homicidal  Thoughts:  No  Memory:  Immediate;   Fair Recent;   Fair  Judgement:  Fair  Insight:  Shallow  Psychomotor Activity:  Normal  Concentration:  Fair  Recall:  [place]: Fair  Akathisia:  Negative  Handed:  Right  AIMS (if indicated):    Assets:  Desire for Improvement Social Support  ADL's:  Intact  Cognition: WNL  Sleep:  fair    Allergies:   Allergies  Allergen Reactions  . Cefaclor Hives  . Erythromycin Hives  . Shellfish-Derived Products Hives   Current Medications: Current Outpatient Medications  Medication Sig Dispense Refill  . buPROPion (WELLBUTRIN XL) 150 MG 24 hr tablet Take 1 tablet (150 mg total) by mouth daily. 90 tablet 0  . busPIRone (BUSPAR) 7.5 MG tablet Take 1 tablet (7.5 mg total) by mouth daily. 30 tablet 1  . etonogestrel-ethinyl estradiol (NUVARING) 0.12-0.015 MG/24HR vaginal ring Place 1 ring vaginally every month.    Marland Kitchen FLUoxetine (PROZAC) 40 MG capsule Take 1 capsule (40 mg total) by mouth daily. 90 capsule 0  . loratadine (CLARITIN) 10 MG tablet Take 10 mg by mouth daily.     No current facility-administered medications for this visit.      Treatment Plan Summary: Medication management and Plan as follows   1. MDD recurrent: doing stable, continue wellbutrin, no seizures 2. GAD better, continue buspar, prozac 3. Pre menstrual dysphoria;not worse, continue prozac meds refilled I discussed the assessment and treatment plan with the patient. The patient was provided an opportunity to ask questions and all were answered. The patient agreed with the plan and demonstrated an understanding of the instructions.   The patient was advised to call back or seek an in-person evaluation if the symptoms worsen or if the condition fails to improve as anticipated. Non face to face time spent 15 min Fu 11m. Or earlier if needed  [name] 7/21/20218:44 AM

## 2020-10-23 ENCOUNTER — Encounter (HOSPITAL_COMMUNITY): Payer: Self-pay | Admitting: Psychiatry

## 2020-10-23 ENCOUNTER — Telehealth (INDEPENDENT_AMBULATORY_CARE_PROVIDER_SITE_OTHER): Payer: 59 | Admitting: Psychiatry

## 2020-10-23 DIAGNOSIS — F3281 Premenstrual dysphoric disorder: Secondary | ICD-10-CM

## 2020-10-23 DIAGNOSIS — F411 Generalized anxiety disorder: Secondary | ICD-10-CM | POA: Diagnosis not present

## 2020-10-23 DIAGNOSIS — F0630 Mood disorder due to known physiological condition, unspecified: Secondary | ICD-10-CM | POA: Diagnosis not present

## 2020-10-23 MED ORDER — FLUOXETINE HCL 40 MG PO CAPS: 40.0000 mg | ORAL_CAPSULE | Freq: Every day | ORAL | 0 refills | Status: DC

## 2020-10-23 MED ORDER — BUSPIRONE HCL 7.5 MG PO TABS: 7.5000 mg | ORAL_TABLET | Freq: Every day | ORAL | 1 refills | Status: DC

## 2020-10-23 NOTE — Progress Notes (Signed)
[facility] Outpatient Follow up visit  Patient Identification: Beverly Anderson MRN:  [id number] Date of Evaluation:  10/23/2020 Referral Source: [place], [place] Chief Complaint:   depression follow up  Visit Diagnosis:    ICD-10-CM   1. Mood disorder in conditions classified elsewhere  F06.30   2. GAD (generalized anxiety disorder)  F41.1   3. Premenstrual dysphoria  F32.81    Diagnosis:   Patient Active Problem List   Diagnosis Date Noted  . Clinical depression [F32.A] 01/22/2016   History of Present Illness:  44 years old currently single Caucasian female lives by herself referred by primary care practice for evaluation and management of depression, anxiety and possible ADD    I connected with Nyra S Rosell on 10/23/20 at  8:30 AM EST by telephone and verified that I am speaking with the correct person using two identifiers.   I discussed the limitations, risks, security and privacy concerns of performing an evaluation and management service by telephone and the availability of in person appointments. I also discussed with the patient that there may be a patient responsible charge related to this service. The patient expressed understanding and agreed to proceed.  Patient location home Provider location home office  Works from home, less stressful. Tolerating meds History of brain tumor and surgery 1992  Dog doing better Handling meds and has a supportive BF  Depression : fluctuates but not worse  Modifying factors : animals,  current boy friend  Duration 10 years plus Severity improved or baseline    denies drug use    Past Medical History:  Past Medical History:  Diagnosis Date  . Brain tumor (benign) ([place])   . Stroke [facility])    as a child    Past Surgical History:  Procedure Laterality Date  . CERVICAL ABLATION     Family History: History reviewed. No pertinent family history. Social History:   Social History   Socioeconomic History  . Marital status:  Single    Spouse name: Not on file  . Number of children: Not on file  . Years of education: Not on file  . Highest education level: Not on file  Occupational History  . Not on file  Tobacco Use  . Smoking status: Never Smoker  . Smokeless tobacco: Never Used  Vaping Use  . Vaping Use: Never used  Substance and Sexual Activity  . Alcohol use: No    Alcohol/week: 0.0 standard drinks  . Drug use: No  . Sexual activity: Yes    Partners: Male  Other Topics Concern  . Not on file  Social History Narrative  . Not on file   Social Determinants of Health   Financial Resource Strain:   . Difficulty of Paying Living Expenses: Not on file  Food Insecurity:   . Worried About Charity fundraiser in the Last Year: Not on file  . Ran Out of Food in the Last Year: Not on file  Transportation Needs:   . Lack of Transportation (Medical): Not on file  . Lack of Transportation (Non-Medical): Not on file  Physical Activity:   . Days of Exercise per Week: Not on file  . Minutes of Exercise per Session: Not on file  Stress:   . Feeling of Stress : Not on file  Social Connections:   . Frequency of Communication with Friends and Family: Not on file  . Frequency of Social Gatherings with Friends and Family: Not on file  . Attends Religious Services:  Not on file  . Active Member of Clubs or Organizations: Not on file  . Attends Archivist Meetings: Not on file  . Marital Status: Not on file        Psychiatric Specialty Exam: Medication Refill    Review of Systems  Constitutional: Positive for malaise/fatigue.  Cardiovascular: Negative for palpitations.  Skin: Negative for itching.  Neurological: Negative for tremors.  Psychiatric/Behavioral: Negative for substance abuse and suicidal ideas. The patient is not nervous/anxious.     There were no vitals taken for this visit.There is no height or weight on file to calculate BMI.  General Appearance:   Eye Contact:   Speech:   Normal Rate  Volume:  Normal  Mood: fair  Affect:  congruent  Thought Process:  Coherent  Orientation:  Full (Time, Place, and Person)  Thought Content:  Rumination  Suicidal Thoughts:  No  Homicidal Thoughts:  No  Memory:  Immediate;   Fair Recent;   Fair  Judgement:  Fair  Insight:  Shallow  Psychomotor Activity:  Normal  Concentration:  Fair  Recall:  [place]: Fair  Akathisia:  Negative  Handed:  Right  AIMS (if indicated):    Assets:  Desire for Improvement Social Support  ADL's:  Intact  Cognition: WNL  Sleep:  fair    Allergies:   Allergies  Allergen Reactions  . Cefaclor Hives  . Erythromycin Hives  . Shellfish-Derived Products Hives   Current Medications: Current Outpatient Medications  Medication Sig Dispense Refill  . buPROPion (WELLBUTRIN XL) 150 MG 24 hr tablet Take 1 tablet (150 mg total) by mouth daily. 90 tablet 0  . busPIRone (BUSPAR) 7.5 MG tablet Take 1 tablet (7.5 mg total) by mouth daily. 30 tablet 1  . etonogestrel-ethinyl estradiol (NUVARING) 0.12-0.015 MG/24HR vaginal ring Place 1 ring vaginally every month.    Marland Kitchen FLUoxetine (PROZAC) 40 MG capsule Take 1 capsule (40 mg total) by mouth daily. 90 capsule 0  . loratadine (CLARITIN) 10 MG tablet Take 10 mg by mouth daily.     No current facility-administered medications for this visit.      Treatment Plan Summary: Medication management and Plan as follows   1. MDD recurrent: stable, conitnue wellbutrin, denies seizures 2. GAD better, continue buspar, prozac 3. Pre menstrual dysphoria;not worse, continue prozac meds refilled I discussed the assessment and treatment plan with the patient. The patient was provided an opportunity to ask questions and all were answered. The patient agreed with the plan and demonstrated an understanding of the instructions.   The patient was advised to call back or seek an in-person evaluation if the symptoms worsen or if the  condition fails to improve as anticipated. Non face to face time spent 15 min Fu 52m. Or earlier if needed  [name] 11/22/20218:59 AM

## 2020-12-25 ENCOUNTER — Other Ambulatory Visit (HOSPITAL_COMMUNITY): Payer: Self-pay

## 2020-12-25 MED ORDER — BUPROPION HCL ER (XL) 150 MG PO TB24: 150.0000 mg | ORAL_TABLET | Freq: Every day | ORAL | 0 refills | Status: DC

## 2020-12-27 ENCOUNTER — Other Ambulatory Visit (HOSPITAL_COMMUNITY): Payer: Self-pay

## 2020-12-27 MED ORDER — BUPROPION HCL ER (XL) 150 MG PO TB24: 150.0000 mg | ORAL_TABLET | Freq: Every day | ORAL | 0 refills | Status: DC

## 2021-02-23 ENCOUNTER — Telehealth (HOSPITAL_COMMUNITY): Payer: 59 | Admitting: Psychiatry

## 2021-03-06 ENCOUNTER — Telehealth (HOSPITAL_COMMUNITY): Payer: 59 | Admitting: Psychiatry

## 2021-03-07 ENCOUNTER — Telehealth (INDEPENDENT_AMBULATORY_CARE_PROVIDER_SITE_OTHER): Payer: 59 | Admitting: Psychiatry

## 2021-03-07 ENCOUNTER — Encounter (HOSPITAL_COMMUNITY): Payer: Self-pay | Admitting: Psychiatry

## 2021-03-07 DIAGNOSIS — F411 Generalized anxiety disorder: Secondary | ICD-10-CM | POA: Diagnosis not present

## 2021-03-07 DIAGNOSIS — F0630 Mood disorder due to known physiological condition, unspecified: Secondary | ICD-10-CM | POA: Diagnosis not present

## 2021-03-07 MED ORDER — BUPROPION HCL ER (XL) 150 MG PO TB24: 150.0000 mg | ORAL_TABLET | Freq: Every day | ORAL | 0 refills | Status: DC

## 2021-03-07 MED ORDER — FLUOXETINE HCL 40 MG PO CAPS: 40.0000 mg | ORAL_CAPSULE | Freq: Every day | ORAL | 0 refills | Status: DC

## 2021-03-07 NOTE — Progress Notes (Signed)
[facility] Outpatient Follow up visit  Patient Identification: Beverly Anderson MRN:  [id number] Date of Evaluation:  03/07/2021 Referral Source: [place], [place] Chief Complaint:   depression follow up  Visit Diagnosis:    ICD-10-CM   1. Mood disorder in conditions classified elsewhere  F06.30   2. GAD (generalized anxiety disorder)  F41.1    Diagnosis:   Patient Active Problem List   Diagnosis Date Noted  . Clinical depression [F32.A] 01/22/2016  Virtual Visit via Telephone Note  I connected with Beverly Anderson on 03/07/21 at  8:30 AM EDT by telephone and verified that I am speaking with the correct person using two identifiers.  Location: Patient: home Provider: home office   I discussed the limitations, risks, security and privacy concerns of performing an evaluation and management service by telephone and the availability of in person appointments. I also discussed with the patient that there may be a patient responsible charge related to this service. The patient expressed understanding and agreed to proceed.     I discussed the assessment and treatment plan with the patient. The patient was provided an opportunity to ask questions and all were answered. The patient agreed with the plan and demonstrated an understanding of the instructions.   The patient was advised to call back or seek an in-person evaluation if the symptoms worsen or if the condition fails to improve as anticipated.  I provided 15  minutes of non-face-to-face time during this encounter.    History of Present Illness:  45  years old currently single Caucasian female lives by herself referred by primary care practice for evaluation and management of depression, anxiety and possible ADD  Has to work from office 3 days now, its stresses due to commute and she likes working from home   History of brain tumor and surgery 1992  Dog doing better Handling meds and has a supportive BF  Depression :fluctuates but  manageable  Modifying factors : animals, Duration 10 years plus Severity baseline   Past Medical History:  Past Medical History:  Diagnosis Date  . Brain tumor (benign) ([place])   . Stroke [facility])    as a child    Past Surgical History:  Procedure Laterality Date  . CERVICAL ABLATION     Family History: No family history on file. Social History:   Social History   Socioeconomic History  . Marital status: Single    Spouse name: Not on file  . Number of children: Not on file  . Years of education: Not on file  . Highest education level: Not on file  Occupational History  . Not on file  Tobacco Use  . Smoking status: Never Smoker  . Smokeless tobacco: Never Used  Vaping Use  . Vaping Use: Never used  Substance and Sexual Activity  . Alcohol use: No    Alcohol/week: 0.0 standard drinks  . Drug use: No  . Sexual activity: Yes    Partners: Male  Other Topics Concern  . Not on file  Social History Narrative  . Not on file   Social Determinants of Health   Financial Resource Strain: Not on file  Food Insecurity: Not on file  Transportation Needs: Not on file  Physical Activity: Not on file  Stress: Not on file  Social Connections: Not on file        Psychiatric Specialty Exam: Medication Refill Pertinent negatives include no chest pain.    Review of Systems  Cardiovascular: Negative for chest pain.  Psychiatric/Behavioral: Negative for substance abuse and suicidal ideas. The patient is not nervous/anxious.     There were no vitals taken for this visit.There is no height or weight on file to calculate BMI.  General Appearance:   Eye Contact:   Speech:  Normal Rate  Volume:  Normal  Mood: fair  Affect:  congruent  Thought Process:  Coherent  Orientation:  Full (Time, Place, and Person)  Thought Content:  Rumination  Suicidal Thoughts:  No  Homicidal Thoughts:  No  Memory:  Immediate;   Fair Recent;   Fair  Judgement:  Fair  Insight:  Shallow   Psychomotor Activity:  Normal  Concentration:  Fair  Recall:  [place]: Fair  Akathisia:  Negative  Handed:  Right  AIMS (if indicated):    Assets:  Desire for Improvement Social Support  ADL's:  Intact  Cognition: WNL  Sleep:  fair    Allergies:   Allergies  Allergen Reactions  . Cefaclor Hives  . Erythromycin Hives  . Shellfish-Derived Products Hives   Current Medications: Current Outpatient Medications  Medication Sig Dispense Refill  . buPROPion (WELLBUTRIN XL) 150 MG 24 hr tablet Take 1 tablet (150 mg total) by mouth daily. 90 tablet 0  . busPIRone (BUSPAR) 7.5 MG tablet Take 1 tablet (7.5 mg total) by mouth daily. 30 tablet 1  . etonogestrel-ethinyl estradiol (NUVARING) 0.12-0.015 MG/24HR vaginal ring Place 1 ring vaginally every month.    Marland Kitchen FLUoxetine (PROZAC) 40 MG capsule Take 1 capsule (40 mg total) by mouth daily. 90 capsule 0  . loratadine (CLARITIN) 10 MG tablet Take 10 mg by mouth daily.     No current facility-administered medications for this visit.      Treatment Plan Summary: Medication management and Plan as follows   1. MDD recurrent: baseline, continue wellbutrin, prozac 2. GAD fair, continue prozac, seldom takes buspar  Sent refills  Fu 3 m or earlier if needed, tolerating meds  [name] 4/6/20228:47 AM

## 2021-06-22 ENCOUNTER — Telehealth (HOSPITAL_COMMUNITY): Payer: 59 | Admitting: Psychiatry

## 2021-07-04 ENCOUNTER — Telehealth (INDEPENDENT_AMBULATORY_CARE_PROVIDER_SITE_OTHER): Payer: 59 | Admitting: Psychiatry

## 2021-07-04 ENCOUNTER — Encounter (HOSPITAL_COMMUNITY): Payer: Self-pay | Admitting: Psychiatry

## 2021-07-04 DIAGNOSIS — F411 Generalized anxiety disorder: Secondary | ICD-10-CM

## 2021-07-04 DIAGNOSIS — F331 Major depressive disorder, recurrent, moderate: Secondary | ICD-10-CM | POA: Diagnosis not present

## 2021-07-04 MED ORDER — BUSPIRONE HCL 7.5 MG PO TABS: 7.5000 mg | ORAL_TABLET | Freq: Every day | ORAL | 1 refills | Status: DC

## 2021-07-04 MED ORDER — FLUOXETINE HCL 40 MG PO CAPS
40.0000 mg | ORAL_CAPSULE | Freq: Every day | ORAL | 0 refills | Status: DC
Start: 2021-07-04 — End: 2022-02-07

## 2021-07-04 MED ORDER — BUPROPION HCL ER (XL) 150 MG PO TB24: 150.0000 mg | ORAL_TABLET | Freq: Every day | ORAL | 0 refills | Status: DC

## 2021-07-04 NOTE — Progress Notes (Signed)
[facility] Outpatient Follow up visit  Patient Identification: ARNEL HAMBLET MRN:  [id number] Date of Evaluation:  07/04/2021 Referral Source: [place], [place] Chief Complaint:   depression follow up  Visit Diagnosis:    ICD-10-CM   1. Moderate episode of recurrent major depressive disorder (HCC)  F33.1     2. GAD (generalized anxiety disorder)  F41.1      Diagnosis:   Patient Active Problem List   Diagnosis Date Noted   Clinical depression [F32.A] 01/22/2016  Virtual Visit via Telephone Note  I connected with Hendel S Acklin on 07/04/21 at  1:15 PM EDT by telephone and verified that I am speaking with the correct person using two identifiers.  Location: Patient: home Provider: home office   I discussed the limitations, risks, security and privacy concerns of performing an evaluation and management service by telephone and the availability of in person appointments. I also discussed with the patient that there may be a patient responsible charge related to this service. The patient expressed understanding and agreed to proceed.     I discussed the assessment and treatment plan with the patient. The patient was provided an opportunity to ask questions and all were answered. The patient agreed with the plan and demonstrated an understanding of the instructions.   The patient was advised to call back or seek an in-person evaluation if the symptoms worsen or if the condition fails to improve as anticipated.  I provided 15  minutes of non-face-to-face time during this encounter.  encounter.    History of Present Illness:  45  years old currently single Caucasian female lives by herself referred by primary care practice for evaluation and management of depression, anxiety and possible ADD  Doing fairly close to some regular stressors but handling it her current [facility] needs to be fixed she talked about that Overall she goes for walks with her dogs   History of brain tumor and surgery  1992  Dog doing better Handling meds and has a supportive BF  Depression :fluctuates but manageable  Modifying factors : Animals Duration 10 years plus Severity baseline   Past Medical History:  Past Medical History:  Diagnosis Date   Brain tumor (benign) ([place])    Stroke ([place])    as a child    Past Surgical History:  Procedure Laterality Date   CERVICAL ABLATION     Family History: History reviewed. No pertinent family history. Social History:   Social History   Socioeconomic History   Marital status: Single    Spouse name: Not on file   Number of children: Not on file   Years of education: Not on file   Highest education level: Not on file  Occupational History   Not on file  Tobacco Use   Smoking status: Never   Smokeless tobacco: Never  Vaping Use   Vaping Use: Never used  Substance and Sexual Activity   Alcohol use: No    Alcohol/week: 0.0 standard drinks   Drug use: No   Sexual activity: Yes    Partners: Male  Other Topics Concern   Not on file  Social History Narrative   Not on file   Social Determinants of Health   Financial Resource Strain: Not on file  Food Insecurity: Not on file  Transportation Needs: Not on file  Physical Activity: Not on file  Stress: Not on file  Social Connections: Not on file        Psychiatric Specialty Exam: Medication Refill  Pertinent negatives include no chest pain.   Review of Systems  Cardiovascular:  Negative for chest pain.  Psychiatric/Behavioral:  Negative for substance abuse and suicidal ideas. The patient is not nervous/anxious.    There were no vitals taken for this visit.There is no height or weight on file to calculate BMI.  General Appearance:   Eye Contact:   Speech:  Normal Rate  Volume:  Normal  Mood: fair  Affect:  congruent  Thought Process:  Coherent  Orientation:  Full (Time, Place, and Person)  Thought Content:  Rumination  Suicidal Thoughts:  No  Homicidal Thoughts:  No  Memory:   Immediate;   Fair Recent;   Fair  Judgement:  Fair  Insight:  Shallow  Psychomotor Activity:  Normal  Concentration:  Fair  Recall:  [place]: Fair  Akathisia:  Negative  Handed:  Right  AIMS (if indicated):    Assets:  Desire for Improvement Social Support  ADL's:  Intact  Cognition: WNL  Sleep:  fair    Allergies:   Allergies  Allergen Reactions   Cefaclor Hives   Erythromycin Hives   Shellfish-Derived Products Hives   Current Medications: Current Outpatient Medications  Medication Sig Dispense Refill   buPROPion (WELLBUTRIN XL) 150 MG 24 hr tablet Take 1 tablet (150 mg total) by mouth daily. 90 tablet 0   busPIRone (BUSPAR) 7.5 MG tablet Take 1 tablet (7.5 mg total) by mouth daily. 30 tablet 1   etonogestrel-ethinyl estradiol (NUVARING) 0.12-0.015 MG/24HR vaginal ring Place 1 ring vaginally every month.     FLUoxetine (PROZAC) 40 MG capsule Take 1 capsule (40 mg total) by mouth daily. 90 capsule 0   loratadine (CLARITIN) 10 MG tablet Take 10 mg by mouth daily.     No current facility-administered medications for this visit.      Treatment Plan Summary: Medication management and Plan as follows   Prior documentation reviewed 1. MDD recurrent: Remains stable continue Wellbutrin and Prozac refill sent  2. GAD doing fairly seldom takes BuSpar continue Prozac work on distraction Follow-up in 3 to 4 months  [name] 8/3/20221:23 PM

## 2021-10-22 ENCOUNTER — Encounter (HOSPITAL_COMMUNITY): Payer: Self-pay | Admitting: Psychiatry

## 2021-10-22 ENCOUNTER — Telehealth (INDEPENDENT_AMBULATORY_CARE_PROVIDER_SITE_OTHER): Payer: 59 | Admitting: Psychiatry

## 2021-10-22 DIAGNOSIS — F331 Major depressive disorder, recurrent, moderate: Secondary | ICD-10-CM | POA: Diagnosis not present

## 2021-10-22 DIAGNOSIS — F411 Generalized anxiety disorder: Secondary | ICD-10-CM | POA: Diagnosis not present

## 2021-10-22 MED ORDER — BUPROPION HCL ER (XL) 150 MG PO TB24: 150.0000 mg | ORAL_TABLET | Freq: Every day | ORAL | 0 refills | Status: DC

## 2021-10-22 NOTE — Progress Notes (Signed)
[facility] Outpatient Follow up visit  Patient Identification: Beverly Anderson MRN:  [id number] Date of Evaluation:  10/22/2021 Referral Source: [place], [place] Chief Complaint:   depression follow up  Visit Diagnosis:    ICD-10-CM   1. Moderate episode of recurrent major depressive disorder (HCC)  F33.1     2. GAD (generalized anxiety disorder)  F41.1      Diagnosis:   Patient Active Problem List   Diagnosis Date Noted   Clinical depression [F32.A] 01/22/2016   Virtual Visit via Telephone Note  I connected with Ailee S Mongillo on 10/22/21 at  4:30 PM EST by telephone and verified that I am speaking with the correct person using two identifiers.  Location: Patient: home  Provider: home office   I discussed the limitations, risks, security and privacy concerns of performing an evaluation and management service by telephone and the availability of in person appointments. I also discussed with the patient that there may be a patient responsible charge related to this service. The patient expressed understanding and agreed to proceed.      I discussed the assessment and treatment plan with the patient. The patient was provided an opportunity to ask questions and all were answered. The patient agreed with the plan and demonstrated an understanding of the instructions.   The patient was advised to call back or seek an in-person evaluation if the symptoms worsen or if the condition fails to improve as anticipated.  I provided 11 minutes of non-face-to-face time during this encounter.      History of Present Illness:  45  years old currently single Caucasian female lives by herself referred by primary care practice for evaluation and management of depression, anxiety and possible ADD  Doing fair, handling stressors, gets headache with cold weather  Overall working from home and managing things   Walks her dogs   History of brain tumor and surgery 1992   Depression :fluctuates but  manageable  Modifying factors : animals Duration 10 years plus Severity baseline   Past Medical History:  Past Medical History:  Diagnosis Date   Brain tumor (benign) ([place])    Stroke ([place])    as a child    Past Surgical History:  Procedure Laterality Date   CERVICAL ABLATION     Family History: No family history on file. Social History:   Social History   Socioeconomic History   Marital status: Single    Spouse name: Not on file   Number of children: Not on file   Years of education: Not on file   Highest education level: Not on file  Occupational History   Not on file  Tobacco Use   Smoking status: Never   Smokeless tobacco: Never  Vaping Use   Vaping Use: Never used  Substance and Sexual Activity   Alcohol use: No    Alcohol/week: 0.0 standard drinks   Drug use: No   Sexual activity: Yes    Partners: Male  Other Topics Concern   Not on file  Social History Narrative   Not on file   Social Determinants of Health   Financial Resource Strain: Not on file  Food Insecurity: Not on file  Transportation Needs: Not on file  Physical Activity: Not on file  Stress: Not on file  Social Connections: Not on file        Psychiatric Specialty Exam: Medication Refill Pertinent negatives include no chest pain.   Review of Systems  Cardiovascular:  Negative for chest  pain.  Psychiatric/Behavioral:  Negative for substance abuse and suicidal ideas. The patient is not nervous/anxious.    There were no vitals taken for this visit.There is no height or weight on file to calculate BMI.  General Appearance:   Eye Contact:   Speech:  Normal Rate  Volume:  Normal  Mood: fair  Affect:   Thought Process:  Coherent  Orientation:  Full (Time, Place, and Person)  Thought Content:  Rumination  Suicidal Thoughts:  No  Homicidal Thoughts:  No  Memory:  Immediate;   Fair Recent;   Fair  Judgement:  Fair  Insight:  Shallow  Psychomotor Activity:  Normal  Concentration:   Fair  Recall:  [place]: Fair  Akathisia:  Negative  Handed:  Right  AIMS (if indicated):    Assets:  Desire for Improvement Social Support  ADL's:  Intact  Cognition: WNL  Sleep:  fair    Allergies:   Allergies  Allergen Reactions   Cefaclor Hives   Erythromycin Hives   Shellfish-Derived Products Hives   Current Medications: Current Outpatient Medications  Medication Sig Dispense Refill   buPROPion (WELLBUTRIN XL) 150 MG 24 hr tablet Take 1 tablet (150 mg total) by mouth daily. 90 tablet 0   busPIRone (BUSPAR) 7.5 MG tablet Take 1 tablet (7.5 mg total) by mouth daily. 30 tablet 1   etonogestrel-ethinyl estradiol (NUVARING) 0.12-0.015 MG/24HR vaginal ring Place 1 ring vaginally every month.     FLUoxetine (PROZAC) 40 MG capsule Take 1 capsule (40 mg total) by mouth daily. 90 capsule 0   loratadine (CLARITIN) 10 MG tablet Take 10 mg by mouth daily.     No current facility-administered medications for this visit.      Treatment Plan Summary: Medication management and Plan as follows   Prior documentation reviewed  1. MDD recurrent: doing stable, continue wellbutrin, prozac   2. GAD fair continue prozac, seldom takes buspar  Fu 34m. Med needs renewed [name] 11/21/20224:42 PM

## 2022-02-07 ENCOUNTER — Encounter (HOSPITAL_COMMUNITY): Payer: Self-pay | Admitting: Psychiatry

## 2022-02-07 ENCOUNTER — Telehealth (INDEPENDENT_AMBULATORY_CARE_PROVIDER_SITE_OTHER): Payer: 59 | Admitting: Psychiatry

## 2022-02-07 DIAGNOSIS — F411 Generalized anxiety disorder: Secondary | ICD-10-CM | POA: Diagnosis not present

## 2022-02-07 DIAGNOSIS — F331 Major depressive disorder, recurrent, moderate: Secondary | ICD-10-CM | POA: Diagnosis not present

## 2022-02-07 DIAGNOSIS — F0630 Mood disorder due to known physiological condition, unspecified: Secondary | ICD-10-CM | POA: Diagnosis not present

## 2022-02-07 MED ORDER — BUSPIRONE HCL 7.5 MG PO TABS: 7.5000 mg | ORAL_TABLET | Freq: Every day | ORAL | 1 refills | Status: DC

## 2022-02-07 MED ORDER — BUPROPION HCL ER (XL) 150 MG PO TB24: 150.0000 mg | ORAL_TABLET | Freq: Every day | ORAL | 0 refills | Status: DC

## 2022-02-07 MED ORDER — FLUOXETINE HCL 40 MG PO CAPS: 40.0000 mg | ORAL_CAPSULE | Freq: Every day | ORAL | 0 refills | Status: DC

## 2022-02-07 NOTE — Progress Notes (Signed)
[facility] Outpatient Follow up visit ? ?Patient Identification: Beverly Anderson ?MRN:  [id number] ?Date of Evaluation:  02/07/2022 ?Referral Source: [place], [place] ?Chief Complaint:   ?depression follow up  ?Visit Diagnosis:  ?  ICD-10-CM   ?1. Moderate episode of recurrent major depressive disorder (HCC)  F33.1   ?  ?2. GAD (generalized anxiety disorder)  F41.1   ?  ?3. Mood disorder in conditions classified elsewhere  F06.30   ?  ? ?Diagnosis:   ?Patient Active Problem List  ? Diagnosis Date Noted  ? 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 ? ?Virtual Visit via Video Note ? ?I connected with Beverly Anderson on 02/07/22 at 10:00 AM EST by a video enabled telemedicine application and verified that I am speaking with the correct person using two identifiers. ? ?Location: ?Patient: parked car ?Provider: office ?  ?I discussed the limitations of evaluation and management by telemedicine and the availability of in person appointments. The patient expressed understanding and agreed to proceed. ? ? ? ?  ?I discussed the assessment and treatment plan with the patient. The patient was provided an opportunity to ask questions and all were answered. The patient agreed with the plan and demonstrated an understanding of the instructions. ?  ?The patient was advised to call back or seek an in-person evaluation if the symptoms worsen or if the condition fails to improve as anticipated. ? ?I provided 15 minutes of non-face-to-face time during this encounter including chart review and documentation ? ? ? ? ?History of Present Illness:  46  years old currently single Caucasian female lives by herself referred by primary care practice for evaluation and management of depression, anxiety and possible ADD ? ?Doing fair handling stress ?Walks her dogs ? ? ? ?History of brain tumor and surgery 1992  ? ?Depression :fluctuates but manageable ? ?Modifying factors : animals ?Duration 10 years plus ?Severity : not worse  ? ? ?Past Medical History:   ?Past Medical History:  ?Diagnosis Date  ? Brain tumor (benign) ([place])   ? Stroke [facility])   ? as a child  ?  ?Past Surgical History:  ?Procedure Laterality Date  ? CERVICAL ABLATION    ? ?Family History: History reviewed. No pertinent family history. ?Social History:   ?Social History  ? ?Socioeconomic History  ? Marital status: Single  ?  Spouse name: Not on file  ? Number of children: Not on file  ? Years of education: Not on file  ? Highest education level: Not on file  ?Occupational History  ? Not on file  ?Tobacco Use  ? Smoking status: Never  ? Smokeless tobacco: Never  ?Vaping Use  ? Vaping Use: Never used  ?Substance and Sexual Activity  ? Alcohol use: No  ?  Alcohol/week: 0.0 standard drinks  ? Drug use: No  ? Sexual activity: Yes  ?  Partners: Male  ?Other Topics Concern  ? Not on file  ?Social History Narrative  ? Not on file  ? ?Social Determinants of Health  ? ?Financial Resource Strain: Not on file  ?Food Insecurity: Not on file  ?Transportation Needs: Not on file  ?Physical Activity: Not on file  ?Stress: Not on file  ?Social Connections: Not on file  ? ? ? ? ? ? ?Psychiatric Specialty Exam: ?Medication Refill ?Pertinent negatives include no chest pain.   ?Review of Systems  ?Cardiovascular:  Negative for chest pain.  ?Psychiatric/Behavioral:  Negative for depression and hallucinations.    ?There were no vitals  taken for this visit.There is no height or weight on file to calculate BMI.  ?General Appearance: casual  ?Eye Contact: fair  ?Speech:  Normal Rate  ?Volume:  Normal  ?Mood: fair  ?Affect: congruent  ?Thought Process:  Coherent  ?Orientation:  Full (Time, Place, and Person)  ?Thought Content:  Rumination  ?Suicidal Thoughts:  No  ?Homicidal Thoughts:  No  ?Memory:  Immediate;   Fair ?Recent;   Fair  ?Judgement:  Fair  ?Insight:  Shallow  ?Psychomotor Activity:  Normal  ?Concentration:  Fair  ?Recall:  Fair  ?[place]  ?Language: Fair  ?Akathisia:  Negative  ?Handed:  Right  ?AIMS  (if indicated):    ?Assets:  Desire for Improvement ?Social Support  ?ADL's:  Intact  ?Cognition: WNL  ?Sleep:  fair  ? ? ?Allergies:   ?Allergies  ?Allergen Reactions  ? Cefaclor Hives  ? Erythromycin Hives  ? Shellfish-Derived Products Hives  ? ?Current Medications: ?Current Outpatient Medications  ?Medication Sig Dispense Refill  ? buPROPion (WELLBUTRIN XL) 150 MG 24 hr tablet Take 1 tablet (150 mg total) by mouth daily. 90 tablet 0  ? busPIRone (BUSPAR) 7.5 MG tablet Take 1 tablet (7.5 mg total) by mouth daily. 30 tablet 1  ? etonogestrel-ethinyl estradiol (NUVARING) 0.12-0.015 MG/24HR vaginal ring Place 1 ring vaginally every month.    ? FLUoxetine (PROZAC) 40 MG capsule Take 1 capsule (40 mg total) by mouth daily. 90 capsule 0  ? loratadine (CLARITIN) 10 MG tablet Take 10 mg by mouth daily.    ? ?No current facility-administered medications for this visit.  ? ? ? ? ?Treatment Plan Summary: ?Medication management and Plan as follows   ? ?Prior documentation reviewed ?Remains stable depression wise, continue wellbutrin and prozac ? ?  ?2. GAD fair continue prozac, buspar  ? ?Fu 24m Med needs renewed ?NMerian Capron?3/9/202310:21 AM ? ?

## 2022-02-14 ENCOUNTER — Telehealth (HOSPITAL_COMMUNITY): Payer: 59 | Admitting: Psychiatry

## 2022-02-21 ENCOUNTER — Other Ambulatory Visit (HOSPITAL_COMMUNITY): Payer: Self-pay

## 2022-02-21 MED ORDER — BUSPIRONE HCL 7.5 MG PO TABS: 7.5000 mg | ORAL_TABLET | Freq: Every day | ORAL | 0 refills | Status: DC

## 2022-05-23 ENCOUNTER — Encounter (HOSPITAL_COMMUNITY): Payer: Self-pay | Admitting: Psychiatry

## 2022-05-23 ENCOUNTER — Ambulatory Visit (HOSPITAL_COMMUNITY): Payer: 59 | Admitting: Psychiatry

## 2022-05-23 VITALS — BP 118/72 | Temp 98.6°F | Ht 63.0 in | Wt 126.0 lb

## 2022-05-23 DIAGNOSIS — F3281 Premenstrual dysphoric disorder: Secondary | ICD-10-CM | POA: Diagnosis not present

## 2022-05-23 DIAGNOSIS — F331 Major depressive disorder, recurrent, moderate: Secondary | ICD-10-CM

## 2022-05-23 DIAGNOSIS — F411 Generalized anxiety disorder: Secondary | ICD-10-CM

## 2022-05-23 MED ORDER — FLUOXETINE HCL 40 MG PO CAPS: 40.0000 mg | ORAL_CAPSULE | Freq: Every day | ORAL | 0 refills | Status: DC

## 2022-05-23 MED ORDER — BUPROPION HCL ER (XL) 150 MG PO TB24: 150.0000 mg | ORAL_TABLET | Freq: Every day | ORAL | 0 refills | Status: DC

## 2022-05-23 MED ORDER — FLUOXETINE HCL 10 MG PO CAPS: 10.0000 mg | ORAL_CAPSULE | Freq: Every day | ORAL | 0 refills | Status: DC

## 2022-05-23 NOTE — Progress Notes (Signed)
[facility] Outpatient Follow up visit  Patient Identification: Beverly Anderson MRN:  [id number] Date of Evaluation:  05/23/2022 Referral Source: [place], [place] Chief Complaint:   depression follow up  Visit Diagnosis:    ICD-10-CM   1. Moderate episode of recurrent major depressive disorder (HCC)  F33.1     2. GAD (generalized anxiety disorder)  F41.1     3. Premenstrual dysphoria  F32.81      Diagnosis:   Patient Active Problem List   Diagnosis Date Noted   Clinical depression [F32.A] 01/22/2016        History of Present Illness:  46  years old currently single Caucasian female lives by herself referred by primary care practice for evaluation and management of depression, anxiety and possible ADD  Doing fair , some job stress but working from home Dog is sick as well  Has more stress or subdued mood during cyble    History of brain tumor and surgery 1992   Depression :fluctuates but more during the menstrual cycle  Modifying factors : animals Duration 10 years plus Severity : during cycle   Past Medical History:  Past Medical History:  Diagnosis Date   Brain tumor (benign) ([place])    Stroke ([place])    as a child    Past Surgical History:  Procedure Laterality Date   CERVICAL ABLATION     Family History: History reviewed. No pertinent family history. Social History:   Social History   Socioeconomic History   Marital status: Single    Spouse name: Not on file   Number of children: Not on file   Years of education: Not on file   Highest education level: Not on file  Occupational History   Not on file  Tobacco Use   Smoking status: Never   Smokeless tobacco: Never  Vaping Use   Vaping Use: Never used  Substance and Sexual Activity   Alcohol use: No    Alcohol/week: 0.0 standard drinks of alcohol   Drug use: No   Sexual activity: Yes    Partners: Male  Other Topics Concern   Not on file  Social History Narrative   Not on file   Social Determinants  of Health   Financial Resource Strain: Not on file  Food Insecurity: Not on file  Transportation Needs: Not on file  Physical Activity: Not on file  Stress: Not on file  Social Connections: Not on file        Psychiatric Specialty Exam: Medication Refill Pertinent negatives include no chest pain.    Review of Systems  Cardiovascular:  Negative for chest pain.  Neurological:  Negative for tremors.  Psychiatric/Behavioral:  Negative for hallucinations.     Blood pressure 118/72, temperature 98.6 F (37 C), height '5\' 3"'$  (1.6 m), weight 126 lb (57.2 kg).Body mass index is 22.32 kg/m.  General Appearance: casual  Eye Contact: fair  Speech:  Normal Rate  Volume:  Normal  Mood: fair  Affect: congruent  Thought Process:  Coherent  Orientation:  Full (Time, Place, and Person)  Thought Content:  Rumination  Suicidal Thoughts:  No  Homicidal Thoughts:  No  Memory:  Immediate;   Fair Recent;   Fair  Judgement:  Fair  Insight:  Shallow  Psychomotor Activity:  Normal  Concentration:  Fair  Recall:  [place]  Language: Fair  Akathisia:  Negative  Handed:  Right  AIMS (if indicated):    Assets:  Desire for Improvement Social Support  ADL's:  Intact  Cognition: WNL  Sleep:  fair    Allergies:   Allergies  Allergen Reactions   Cefaclor Hives   Erythromycin Hives   Shellfish-Derived Products Hives   Current Medications: Current Outpatient Medications  Medication Sig Dispense Refill   FLUoxetine (PROZAC) 10 MG capsule Take 1 capsule (10 mg total) by mouth daily. Take during mestrual cycle for 5 days with the '40mg'$ . Total dose during that time '50mg'$  30 capsule 0   buPROPion (WELLBUTRIN XL) 150 MG 24 hr tablet Take 1 tablet (150 mg total) by mouth daily. 90 tablet 0   busPIRone (BUSPAR) 7.5 MG tablet Take 1 tablet (7.5 mg total) by mouth daily. 90 tablet 0   etonogestrel-ethinyl estradiol (NUVARING) 0.12-0.015 MG/24HR vaginal ring Place 1 ring  vaginally every month.     FLUoxetine (PROZAC) 40 MG capsule Take 1 capsule (40 mg total) by mouth daily. 90 capsule 0   loratadine (CLARITIN) 10 MG tablet Take 10 mg by mouth daily.     No current facility-administered medications for this visit.      Treatment Plan Summary: Medication management and Plan as follows    Prior documentation reviewed Depression manageable continue prozac, wellbutrin    2. GAD  fair continue buspar, prozac, seldom takes buspar 3. Pre menstrjual dysphoria: has more symptoms during cycle, will add '10mg'$  of prozac during the cycle period making dose total of '50mg'$  Direct care time spent 15 min face to face with med adjustment  Fu 38m Med needs renewed NMerian Capron6/22/202310:39 AM

## 2022-06-06 ENCOUNTER — Other Ambulatory Visit (HOSPITAL_COMMUNITY): Payer: Self-pay

## 2022-06-06 MED ORDER — FLUOXETINE HCL 10 MG PO CAPS: 10.0000 mg | ORAL_CAPSULE | Freq: Every day | ORAL | 0 refills | Status: DC

## 2022-10-10 ENCOUNTER — Telehealth (HOSPITAL_COMMUNITY): Payer: 59 | Admitting: Psychiatry

## 2022-10-10 ENCOUNTER — Encounter (HOSPITAL_COMMUNITY): Payer: Self-pay

## 2022-10-17 ENCOUNTER — Telehealth (HOSPITAL_COMMUNITY): Payer: 59 | Admitting: Psychiatry

## 2022-10-22 ENCOUNTER — Other Ambulatory Visit (HOSPITAL_COMMUNITY): Payer: Self-pay

## 2022-10-22 MED ORDER — FLUOXETINE HCL 40 MG PO CAPS: 40.0000 mg | ORAL_CAPSULE | Freq: Every day | ORAL | 0 refills | Status: DC

## 2022-11-18 ENCOUNTER — Encounter (HOSPITAL_COMMUNITY): Payer: Self-pay

## 2022-11-18 ENCOUNTER — Telehealth (HOSPITAL_COMMUNITY): Payer: 59 | Admitting: Psychiatry

## 2023-03-25 ENCOUNTER — Telehealth (HOSPITAL_COMMUNITY): Payer: Self-pay | Admitting: Psychiatry

## 2023-03-25 NOTE — Telephone Encounter (Signed)
Patient called in requesting a refill of buPROPion (WELLBUTRIN XL) 150 MG 24 hr tablet.  Pharmacy: [id number] [place], [place] - [address] (Ph: [phone])

## 2023-03-26 NOTE — Telephone Encounter (Signed)
Medication management - message left on pt's voice activated recorder of her need to call our office back to schedule a new appointment before any further refills would be authorized.

## 2023-07-15 ENCOUNTER — Telehealth (HOSPITAL_COMMUNITY): Payer: Self-pay | Admitting: *Deleted

## 2023-07-15 NOTE — Telephone Encounter (Signed)
Rx Refill Request-- [id number] - [place], [place] - [address]   buPROPion (WELLBUTRIN XL) 150 MG 24 hr tablet 90 tablet 0 05/23/2022 --   Sig - Route: Take 1 tablet (150 mg total) by mouth  daily. - Oral     Last Seen 02/08/23 N/S 11/19/23   10/11/23 Cancel  10/18/23

## 2023-08-13 ENCOUNTER — Telehealth (HOSPITAL_COMMUNITY): Payer: Self-pay | Admitting: Psychiatry

## 2023-08-13 ENCOUNTER — Ambulatory Visit (HOSPITAL_COMMUNITY): Payer: 59 | Admitting: Psychiatry

## 2023-08-13 NOTE — Telephone Encounter (Signed)
Patient last seen by provider June, 2023 and was scheduled for a "New Patient Appointment" for today that was cancelled due to provider being out of the office unexpectedly. Patient called requesting refills of:   FLUoxetine (PROZAC) 10 MG capsule   Last ordered: 06/06/2022 - 90 capsules   & FLUoxetine (PROZAC) 40 MG capsule.  Last ordered: 10/22/2022 - 30 capsules  [id number] [place], [place] - [address] Phone: [phone]  Fax: [phone]     Last visit: 05/23/2022  Next visit: 09/24/2023  Previous refill requests in April & August 2024 for buPROPion (WELLBUTRIN XL) 150 MG 24 hr tablet were denied until appointment was scheduled/completed.

## 2023-08-14 MED ORDER — FLUOXETINE HCL 10 MG PO CAPS: 10.0000 mg | ORAL_CAPSULE | Freq: Every day | ORAL | 0 refills | Status: DC

## 2023-08-14 MED ORDER — FLUOXETINE HCL 40 MG PO CAPS: 40.0000 mg | ORAL_CAPSULE | Freq: Every day | ORAL | 0 refills | Status: DC

## 2023-08-18 ENCOUNTER — Telehealth (HOSPITAL_COMMUNITY): Payer: Self-pay | Admitting: *Deleted

## 2023-08-18 MED ORDER — FLUOXETINE HCL 40 MG PO CAPS: 40.0000 mg | ORAL_CAPSULE | Freq: Every day | ORAL | 0 refills | Status: DC

## 2023-08-18 NOTE — Addendum Note (Signed)
Addended by: Thresa Ross on: 08/18/2023 09:45 AM   Modules accepted: Orders

## 2023-08-18 NOTE — Telephone Encounter (Signed)
Rx REFILL REQUEST --  [id number] - [place], [place] - [address]    Disp Refills Start End   FLUoxetine (PROZAC) 40 MG capsule 30 capsule 0 08/14/2023 --   Sig - Route: Take 1 capsule (40 mg total) by mouth daily. - Oral   Sent to pharmacy as: FLUoxetine (PROZAC) 40 MG capsule     NEXT APPT --09/24/23 LAST APPT -- 08/13/23

## 2023-09-24 ENCOUNTER — Ambulatory Visit (INDEPENDENT_AMBULATORY_CARE_PROVIDER_SITE_OTHER): Payer: 59 | Admitting: Psychiatry

## 2023-09-24 ENCOUNTER — Encounter (HOSPITAL_COMMUNITY): Payer: Self-pay | Admitting: Psychiatry

## 2023-09-24 DIAGNOSIS — F331 Major depressive disorder, recurrent, moderate: Secondary | ICD-10-CM

## 2023-09-24 DIAGNOSIS — F411 Generalized anxiety disorder: Secondary | ICD-10-CM

## 2023-09-24 DIAGNOSIS — F0630 Mood disorder due to known physiological condition, unspecified: Secondary | ICD-10-CM

## 2023-09-24 MED ORDER — BUPROPION HCL ER (XL) 150 MG PO TB24: 150.0000 mg | ORAL_TABLET | Freq: Every day | ORAL | 0 refills | Status: DC

## 2023-09-24 MED ORDER — BUSPIRONE HCL 7.5 MG PO TABS: 7.5000 mg | ORAL_TABLET | Freq: Every day | ORAL | 0 refills | Status: DC

## 2023-09-24 MED ORDER — FLUOXETINE HCL 40 MG PO CAPS: 40.0000 mg | ORAL_CAPSULE | Freq: Every day | ORAL | 0 refills | Status: DC

## 2023-09-24 MED ORDER — FLUOXETINE HCL 10 MG PO CAPS: 10.0000 mg | ORAL_CAPSULE | Freq: Every day | ORAL | 0 refills | Status: DC

## 2023-09-24 NOTE — Progress Notes (Signed)
[facility] Re establish care   Patient Identification: Beverly Anderson MRN:  [id number] Date of Evaluation:  09/24/2023 Referral Source: [place], [place] Chief Complaint:   Chief Complaint   Establish Care   depression follow up  Visit Diagnosis:    ICD-10-CM   1. Moderate episode of recurrent major depressive disorder (HCC)  F33.1     2. GAD (generalized anxiety disorder)  F41.1     3. Mood disorder in conditions classified elsewhere  F06.30      Diagnosis:   Patient Active Problem List   Diagnosis Date Noted   Clinical depression [F32.A] 01/22/2016     Virtual Visit via Video Note  I connected with Beverly Anderson on 09/24/23 at  9:00 AM EDT by a video enabled telemedicine application and verified that I am speaking with the correct person using two identifiers.  Location: Patient: home Provider: home office   I discussed the limitations of evaluation and management by telemedicine and the availability of in person appointments. The patient expressed understanding and agreed to proceed.      I discussed the assessment and treatment plan with the patient. The patient was provided an opportunity to ask questions and all were answered. The patient agreed with the plan and demonstrated an understanding of the instructions.   The patient was advised to call back or seek an in-person evaluation if the symptoms worsen or if the condition fails to improve as anticipated.  I provided 30 minutes of non-face-to-face time during this encounter.    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  Depression is manageable no hopelessness or suicidal thoughts  Anxiety is fair and relavant to regular stressors overall no panic attacks or extreme anxiety     History of brain tumor and surgery 1992   Depression :fluctuates but more during the menstrual cycle Aggravating factor: past difficult childhood and surgery  Modifying factors : animals, has a dog, also now engaged Duration 11 years plus Severity : manageable   Past Medical History:  Past Medical History:  Diagnosis Date   Brain tumor (benign) ([facility])    Stroke ([facility])    as a child    Past Surgical History:  Procedure Laterality Date   CERVICAL ABLATION     Family History: History reviewed. No pertinent family history. Social History:   Social History   Socioeconomic History   Marital status: Single    Spouse name: Not on file   Number of children: Not on file   Years of education: Not on file   Highest education level: Not on file  Occupational History   Not on file  Tobacco Use   Smoking status: Never   Smokeless tobacco:  Never  Vaping Use   Vaping status: Never Used  Substance and Sexual Activity   Alcohol use: No    Alcohol/week: 0.0 standard drinks of alcohol   Drug use: No   Sexual activity: Yes    Partners: Male  Other Topics Concern   Not on file  Social History Narrative   Not on file   Social Determinants of Health   Financial Resource Strain: Low Risk  (07/24/2023)   Received from [facility]   Overall Financial Resource Strain (CARDIA)    Difficulty of Paying Living Expenses: Not very hard  Food Insecurity: Patient Declined (07/24/2023)    Received from [facility]   Hunger Vital Sign    Worried About Running Out of Food in the Last Year: Patient declined    Ran Out of Food in the Last Year: Patient declined  Transportation Needs: Patient Declined (07/24/2023)   Received from [facility] - Transportation    Lack of Transportation (Medical): Patient declined    Lack of Transportation (Non-Medical): Patient declined  Physical Activity: Unknown (07/24/2023)   Received from [facility]   Exercise Vital Sign    Days of Exercise per Week: Patient declined    Minutes of Exercise per Session: Not on file  Stress: Patient Declined (07/24/2023)   Received from [facility] - Occupational Stress Questionnaire    Feeling of Stress : Patient declined  Social Connections: Unknown (03/31/2022)   Received from [facility]   Social Network    Social Network: Not on file        Psychiatric Specialty Exam: Medication Refill Pertinent negatives include no chest pain.    Review of Systems  Cardiovascular:  Negative for chest pain.  Neurological:  Negative for tremors.  Psychiatric/Behavioral:  Negative for hallucinations.     There were no vitals taken for this visit.There is no height or weight on file to calculate BMI.  General Appearance: casual  Eye Contact: fair  Speech:  Normal Rate  Volume:  Normal  Mood: fair  Affect: congruent  Thought Process:  Coherent  Orientation:  Full (Time, Place, and Person)  Thought Content:  Rumination  Suicidal Thoughts:  No  Homicidal Thoughts:  No  Memory:  Immediate;   Fair Recent;   Fair  Judgement:  Fair  Insight:  Shallow  Psychomotor Activity:  Normal  Concentration:  Fair  Recall:  [employer] of Knowledge:Fair  Language: Fair  Akathisia:  Negative  Handed:  Right  AIMS (if indicated):    Assets:  Desire for Improvement Social Support  ADL's:  Intact  Cognition: WNL  Sleep:  fair     Allergies:   Allergies  Allergen Reactions   Cefaclor Hives   Erythromycin Hives   Shellfish-Derived Products Hives   Current Medications: Current Outpatient Medications  Medication Sig Dispense Refill   buPROPion (WELLBUTRIN XL) 150 MG 24 hr tablet Take 1 tablet (150 mg total) by mouth daily. 90 tablet 0   busPIRone (BUSPAR) 7.5 MG tablet Take 1 tablet (7.5 mg total) by mouth daily. 90 tablet 0   etonogestrel-ethinyl estradiol (NUVARING) 0.12-0.015 MG/24HR vaginal ring Place 1 ring vaginally every month.     FLUoxetine (PROZAC) 10 MG capsule Take 1 capsule (10 mg total) by mouth daily. 90 capsule 0   FLUoxetine (PROZAC) 40 MG capsule Take 1 capsule (40 mg total) by mouth daily. 90 capsule 0   loratadine (CLARITIN) 10  MG tablet Take 10 mg by mouth daily.     No current facility-administered medications for this visit.      Treatment Plan Summary: Medication management and Plan as follows   Prior documentation reviewed Depression  MDD recurrent; manageable continue prozac, wellbutrin, no side effects    2. GAD  manageable continue buspar, prozac  3. Pre menstrjual dysphoria: manageable continue prozac  Also follow with OB to work on peri menopausal concerns  Fu 40m. Meds renewed and discussed  Thresa Ross 10/23/20249:20 AM

## 2023-09-29 ENCOUNTER — Telehealth (HOSPITAL_COMMUNITY): Payer: Self-pay | Admitting: *Deleted

## 2023-09-29 NOTE — Telephone Encounter (Signed)
Stated that was told she could call to update about her perimenopause & she stated that she is feeling very scattered brain. Says she's also having hot flashes.  Suggested she follow uo with her OB-GYN provider.

## 2023-12-31 ENCOUNTER — Telehealth (HOSPITAL_COMMUNITY): Payer: 59 | Admitting: Psychiatry

## 2023-12-31 ENCOUNTER — Encounter (HOSPITAL_COMMUNITY): Payer: Self-pay | Admitting: Psychiatry

## 2023-12-31 DIAGNOSIS — F411 Generalized anxiety disorder: Secondary | ICD-10-CM | POA: Diagnosis not present

## 2023-12-31 DIAGNOSIS — F3281 Premenstrual dysphoric disorder: Secondary | ICD-10-CM

## 2023-12-31 DIAGNOSIS — F0630 Mood disorder due to known physiological condition, unspecified: Secondary | ICD-10-CM

## 2023-12-31 DIAGNOSIS — F331 Major depressive disorder, recurrent, moderate: Secondary | ICD-10-CM

## 2023-12-31 MED ORDER — BUPROPION HCL ER (XL) 150 MG PO TB24: 150.0000 mg | ORAL_TABLET | Freq: Every day | ORAL | 0 refills | Status: DC

## 2023-12-31 MED ORDER — FLUOXETINE HCL 10 MG PO CAPS: 10.0000 mg | ORAL_CAPSULE | Freq: Every day | ORAL | 0 refills | Status: DC

## 2023-12-31 MED ORDER — FLUOXETINE HCL 40 MG PO CAPS: 40.0000 mg | ORAL_CAPSULE | Freq: Every day | ORAL | 0 refills | Status: DC

## 2023-12-31 MED ORDER — BUSPIRONE HCL 7.5 MG PO TABS: 7.5000 mg | ORAL_TABLET | Freq: Every day | ORAL | 0 refills | Status: DC

## 2023-12-31 NOTE — Progress Notes (Signed)
Beverly Anderson establish care   Patient Identification: Beverly Anderson MRN:  [id number] Date of Evaluation:  12/31/2023 Referral Source: Yevonne Pax, [place] Chief Complaint:    depression follow up  Visit Diagnosis:    ICD-10-CM   1. Moderate episode of recurrent major depressive disorder (HCC)  F33.1     2. GAD (generalized anxiety disorder)  F41.1     3. Mood disorder in conditions classified elsewhere  F06.30     4. Premenstrual dysphoria  F32.81      Diagnosis:   Patient Active Problem List   Diagnosis Date Noted   Clinical depression [F32.A] 01/22/2016   Virtual Visit via Video Note  I connected with Beverly Anderson on 12/31/23 at 10:00 AM EST by a video enabled telemedicine application and verified that I am speaking with the correct person using two identifiers.  Location: Patient: home Provider: home office   I discussed the limitations of evaluation and management by telemedicine and the availability of in person appointments. The patient expressed understanding and agreed to proceed.      I discussed the assessment and treatment plan with the patient. The patient was provided an opportunity to ask questions and all were answered. The patient agreed with the plan and demonstrated an understanding of the instructions.   The patient was advised to call back or seek an in-person evaluation if the symptoms worsen or if the condition fails to improve as anticipated.  I provided 18 minutes of non-face-to-face time during this encounter.    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   Have  to deal with possible step daughter age 15  Depression is stable and working is going on well  Has history of depression and GAD.  Anxiety manageable    History of brain tumor and surgery 1992   Depression :fluctuates but more during the menstrual cycle Aggravating factor: past difficult childhood and surgery  Modifying factors : animals,  has a dog,  Duration 11 years plus Severity : manageable and better   Past Medical History:  Past Medical History:  Diagnosis Date   Brain tumor (benign) (HCC)    Stroke (HCC)    as a child    Past Surgical History:  Procedure Laterality Date   CERVICAL ABLATION     Family History: History reviewed. No pertinent family history. Social History:   Social History   Socioeconomic History   Marital status: Single    Spouse name: Not on file   Number of children: Not on file   Years of education: Not on file   Highest education level: Not on file  Occupational History   Not on file  Tobacco Use   Smoking status: Never   Smokeless tobacco: Never  Vaping Use   Vaping status: Never Used  Substance and Sexual Activity   Alcohol use: No    Alcohol/week: 0.0 standard drinks of alcohol   Drug use: No   Sexual activity: Yes    Partners: Male  Other Topics Concern   Not on file  Social History Narrative   Not on file   Social  Drivers of Health   Financial Resource Strain: Low Risk  (07/24/2023)   Received from [facility]   Overall Financial Resource Strain (CARDIA)    Difficulty of Paying Living Expenses: Not very hard  Food Insecurity: Patient Declined (07/24/2023)   Received from [facility]   Hunger Vital Sign    Worried About Running Out of Food in the Last Year: Patient declined    Ran Out of Food in the Last Year: Patient declined  Transportation Needs: Patient Declined (07/24/2023)   Received from [facility] - Transportation    Lack of Transportation (Medical): Patient declined    Lack of Transportation  (Non-Medical): Patient declined  Physical Activity: Unknown (07/24/2023)   Received from [facility]   Exercise Vital Sign    Days of Exercise per Week: Patient declined    Minutes of Exercise per Session: Not on file  Stress: Patient Declined (07/24/2023)   Received from [facility] - Occupational Stress Questionnaire    Feeling of Stress : Patient declined  Social Connections: Unknown (03/31/2022)   Received from [facility]   Social Network    Social Network: Not on file        Psychiatric Specialty Exam: Medication Refill Pertinent negatives include no chest pain.    Review of Systems  Cardiovascular:  Negative for chest pain.  Neurological:  Negative for tremors.  Psychiatric/Behavioral:  Negative for hallucinations.     There were no vitals taken for this visit.There is no height or weight on file to calculate BMI.  General Appearance: casual  Eye Contact: fair  Speech:  Normal Rate  Volume:  Normal  Mood: fair  Affect: congruent  Thought Process:  Coherent  Orientation:  Full (Time, Place, and Person)  Thought Content:  Rumination  Suicidal Thoughts:  No  Homicidal Thoughts:  No  Memory:  Immediate;   Fair Recent;   Fair  Judgement:  Fair  Insight:  Shallow  Psychomotor Activity:  Normal  Concentration:  Fair  Recall:  [employer] of Knowledge:Fair  Language: Fair  Akathisia:  Negative  Handed:  Right  AIMS (if indicated):    Assets:  Desire for Improvement Social Support  ADL's:  Intact  Cognition: WNL  Sleep:  fair    Allergies:   Allergies  Allergen Reactions   Cefaclor Hives   Erythromycin Hives   Shellfish-Derived Products Hives   Current Medications: Current Outpatient Medications  Medication Sig Dispense Refill   buPROPion (WELLBUTRIN XL) 150 MG 24 hr tablet Take 1 tablet (150 mg total) by mouth daily. 90 tablet 0   busPIRone (BUSPAR) 7.5 MG tablet Take 1 tablet (7.5 mg  total) by mouth daily. 90 tablet 0   etonogestrel-ethinyl estradiol (NUVARING) 0.12-0.015 MG/24HR vaginal ring Place 1 ring vaginally every month.     FLUoxetine (PROZAC) 10 MG capsule Take 1 capsule (10 mg total) by mouth daily. 90 capsule 0   FLUoxetine (PROZAC) 40 MG capsule Take 1 capsule (40 mg total) by mouth daily. 90 capsule 0   loratadine (CLARITIN) 10 MG tablet Take 10 mg by mouth daily.     No current facility-administered medications for this visit.      Treatment Plan Summary: Medication management and Plan as follows    Prior documentation reviewed  Depression  MDD recurrent; manageable conitnue wellbutrin, prozac    2. GAD  doing fair continue prozac, buspar   3. Pre menstrjual dysphoria:  manageable with prozac  Reviewed meds and sent refills  Fu 6 m.   Thresa Ross 1/29/202510:07 AM

## 2024-02-23 ENCOUNTER — Telehealth (HOSPITAL_COMMUNITY): Payer: Self-pay | Admitting: *Deleted

## 2024-02-23 MED ORDER — BUPROPION HCL ER (XL) 150 MG PO TB24: 150.0000 mg | ORAL_TABLET | Freq: Every day | ORAL | 0 refills | Status: DC

## 2024-02-23 NOTE — Addendum Note (Signed)
 Addended by: Thresa Ross on: 02/23/2024 09:24 AM   Modules accepted: Orders

## 2024-02-23 NOTE — Telephone Encounter (Signed)
 Insurance Request 90 Day Rx  buPROPion (WELLBUTRIN XL) 150 MG 24 hr tablet   [id number] - [place], [place] - [address]    NEXT APPT -- 06/30/24 LAST APPT -- 09/24/23

## 2024-02-23 NOTE — Telephone Encounter (Signed)
 nsurance Request 90 Day Rx   buPROPion (WELLBUTRIN XL) 150 MG 24 hr tablet    [id number] - [place], [place] - [address]      NEXT APPT -- 06/30/24 LAST APPT -- 09/24/23

## 2024-02-24 ENCOUNTER — Telehealth (HOSPITAL_COMMUNITY): Payer: Self-pay | Admitting: *Deleted

## 2024-02-24 NOTE — Telephone Encounter (Signed)
 Automated refill request came in again already sent refill in x 1 week

## 2024-03-29 ENCOUNTER — Telehealth (HOSPITAL_COMMUNITY): Payer: Self-pay

## 2024-03-29 MED ORDER — BUSPIRONE HCL 7.5 MG PO TABS: 7.5000 mg | ORAL_TABLET | Freq: Every day | ORAL | 0 refills | Status: DC

## 2024-03-29 NOTE — Telephone Encounter (Signed)
 Medication refill - [facility] requesting a new 90 day order of Buspirone  7.5 mg and last ordered 12/31/23.Patient returns next on 06/30/24.

## 2024-04-07 ENCOUNTER — Telehealth (HOSPITAL_COMMUNITY): Payer: Self-pay | Admitting: Psychiatry

## 2024-04-07 NOTE — Telephone Encounter (Signed)
 Spoke to patient. Informed her that Dr. Aram Knights would like to see her for a sooner appointment to discuss possible medication changes.   Appointment made.   Nothing Further Needed at this time.

## 2024-04-07 NOTE — Telephone Encounter (Signed)
 Patient is calling. (She left VM last night.)  She states she is very scatter brained, and not doing well on her medication. She would like to change this or speak to Dr. Aram Knights about the medication.   Would you like for me to make her a sooner appointment?  Next Visit - 7/30 Last Visit - 1/29

## 2024-04-21 ENCOUNTER — Telehealth (HOSPITAL_COMMUNITY): Admitting: Psychiatry

## 2024-06-30 ENCOUNTER — Encounter (HOSPITAL_COMMUNITY): Payer: Self-pay | Admitting: Psychiatry

## 2024-06-30 ENCOUNTER — Telehealth (INDEPENDENT_AMBULATORY_CARE_PROVIDER_SITE_OTHER): Payer: 59 | Admitting: Psychiatry

## 2024-06-30 DIAGNOSIS — F331 Major depressive disorder, recurrent, moderate: Secondary | ICD-10-CM

## 2024-06-30 DIAGNOSIS — F411 Generalized anxiety disorder: Secondary | ICD-10-CM

## 2024-06-30 DIAGNOSIS — F0630 Mood disorder due to known physiological condition, unspecified: Secondary | ICD-10-CM

## 2024-06-30 MED ORDER — BUSPIRONE HCL 7.5 MG PO TABS: 7.5000 mg | ORAL_TABLET | Freq: Every day | ORAL | 0 refills | Status: AC

## 2024-06-30 MED ORDER — BUPROPION HCL ER (XL) 150 MG PO TB24: 150.0000 mg | ORAL_TABLET | Freq: Every day | ORAL | 0 refills | Status: DC

## 2024-06-30 MED ORDER — FLUOXETINE HCL 40 MG PO CAPS: 40.0000 mg | ORAL_CAPSULE | Freq: Every day | ORAL | 0 refills | Status: DC

## 2024-06-30 NOTE — Progress Notes (Signed)
 [facility] establish care   Patient Identification: Beverly Anderson MRN:  [id number] Date of Evaluation:  06/30/2024 Referral Source: Donald Cowman, [place] Chief Complaint:    depression follow up  Visit Diagnosis:    ICD-10-CM   1. Moderate episode of recurrent major depressive disorder (HCC)  F33.1     2. GAD (generalized anxiety disorder)  F41.1     3. 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  Location: Patient: home Provider: home office   I discussed the limitations of evaluation and management by telemedicine and the availability of in person appointments. The patient expressed understanding and agreed to proceed.     I discussed the assessment and treatment plan with the patient. The patient was provided an opportunity to ask questions and all were answered. The patient agreed with the plan and demonstrated an understanding of the instructions.   The patient was advised to call back or seek an in-person evaluation if the symptoms worsen or if the condition fails to improve as anticipated.  I provided 20 minutes of non-face-to-face time during this encounter.    History of Present Illness:  48  years old currently single Caucasian female lives by herself referred initially by primary care practice for evaluation and management of depression, anxiety  Synopsis : Past history of brain tumor and surgery age 45 with later stroke and partial blindness, was bullied when young due to her medical illness and limitations History of brain tumor and surgery 1992   Patient last seen was 6 months ago was doing reasonable but up and she has stopped taking  Wellbutrin  for a couple of months and is experiencing mood symptoms getting distracted decreased energy feels she may need to get checkup with OB/GYN as she is having premenstrual symptoms hot flushing as well She feels distracted 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 but also going through the following stressors  She is getting married in end of this year Trying to sell the house and going through that stress  Depression :fluctuates but more during the menstrual cycle Aggravating factor: Difficult childhood, current stressors Modifying factors : animals, dog but he is sick Duration 11 years plus Severity : Getting subdued easily agitated  Past Medical History:  Past Medical History:  Diagnosis Date   Brain tumor (benign) (HCC)    Stroke (HCC)    as a child    Past Surgical History:  Procedure Laterality Date   CERVICAL ABLATION     Family History: History reviewed. No pertinent family history. Social History:   Social History   Socioeconomic History   Marital status: Single    Spouse name: Not on file   Number of children: Not on file   Years of education: Not on file   Highest education level: Not on file  Occupational History   Not on file  Tobacco Use   Smoking status: Never   Smokeless tobacco: Never  Vaping Use   Vaping status: Never Used  Substance and Sexual Activity   Alcohol use: No    Alcohol/week: 0.0 standard drinks of alcohol   Drug use: No   Sexual  activity: Yes    Partners: Male  Other Topics Concern   Not on file  Social History Narrative   Not on file   Social Drivers of Health   Financial Resource Strain: Low Risk  (07/24/2023)   Received from [facility]   Overall Financial Resource Strain (CARDIA)    Difficulty of Paying Living Expenses: Not very hard  Food Insecurity: Patient Declined (07/24/2023)   Received from [facility]   Hunger Vital Sign    Worried About Running Out of Food in the Last Year: Patient declined     Ran Out of Food in the Last Year: Patient declined  Transportation Needs: Patient Declined (07/24/2023)   Received from [facility] - Transportation    Lack of Transportation (Medical): Patient declined    Lack of Transportation (Non-Medical): Patient declined  Physical Activity: Unknown (07/24/2023)   Received from [facility]   Exercise Vital Sign    Days of Exercise per Week: Patient declined    Minutes of Exercise per Session: Not on file  Stress: Patient Declined (07/24/2023)   Received from [facility] - Occupational Stress Questionnaire    Feeling of Stress : Patient declined  Social Connections: Unknown (03/31/2022)   Received from [facility]   Social Network    Social Network: Not on file        Psychiatric Specialty Exam: Medication Refill Pertinent negatives include no chest pain.    Review of Systems  Cardiovascular:  Negative for chest pain.  Neurological:  Negative for tremors.  Psychiatric/Behavioral:  Negative for hallucinations.     There were no vitals taken for this visit.There is no height or weight on file to calculate BMI.  General Appearance: casual  Eye Contact: fair  Speech:  Normal Rate  Volume:  Normal  Mood: Somewhat stressed  Affect: congruent  Thought Process:  Coherent  Orientation:  Full (Time, Place, and Person)  Thought Content:  Rumination  Suicidal Thoughts:  No  Homicidal Thoughts:  No  Memory:  Immediate;   Fair Recent;   Fair  Judgement:  Fair  Insight:  Shallow  Psychomotor Activity:  Normal  Concentration:  Fair  Recall:  [employer] of Knowledge:Fair  Language: Fair  Akathisia:  Negative  Handed:  Right  AIMS (if indicated):    Assets:  Desire for Improvement Social Support  ADL's:  Intact  Cognition: WNL  Sleep:  fair    Allergies:   Allergies  Allergen Reactions   Cefaclor Hives   Erythromycin Hives   Shellfish-Derived Products Hives   Current  Medications: Current Outpatient Medications  Medication Sig Dispense Refill   buPROPion  (WELLBUTRIN  XL) 150 MG 24 hr tablet Take 1 tablet (150 mg total) by mouth daily. 90 tablet 0   busPIRone  (BUSPAR ) 7.5 MG tablet Take 1 tablet (7.5 mg total) by mouth daily. 90 tablet 0   etonogestrel-ethinyl estradiol (NUVARING) 0.12-0.015 MG/24HR vaginal ring Place 1 ring vaginally every month.     FLUoxetine  (PROZAC ) 40 MG capsule Take 1 capsule (40 mg total) by mouth daily. 90 capsule 0   loratadine (CLARITIN) 10 MG tablet Take 10 mg by mouth daily.     No current facility-administered medications for this visit.      Treatment Plan Summary: Medication management and Plan as follows  Prior documentation reviewed  Depression  MDD recurrent; subdued easily overwhelmed.  Restart back Wellbutrin  she has not been taking it.  She  is also taking Prozac  40 mg instead of 50 mg.  I do recommend therapy but she wants to hold off for now until she makes an appointment with OB/GYN and review hormone therapy 2. GAD; feeling stressed overwhelmed at times continue Prozac  at 40 mg she is taking BuSpar  once a day recommend to take it 2 times a day for the next 3 to 4 days and evaluate if she wants to continue 2 times a day can call for early refill   3. Pre menstrjual dysphoria: She feels she is premenopausal will check with OB/GYN she has an appointment continue Prozac  for now   Patient not suicidal discussed reviewed medication and compliance follow-up in 1 or 2 months or earlier if needed     Jackey Flight 7/30/202510:44 AM

## 2024-07-07 ENCOUNTER — Telehealth (HOSPITAL_COMMUNITY): Payer: Self-pay | Admitting: Psychiatry

## 2024-07-07 MED ORDER — FLUOXETINE HCL 10 MG PO CAPS: 10.0000 mg | ORAL_CAPSULE | Freq: Every day | ORAL | 0 refills | Status: DC

## 2024-07-07 NOTE — Telephone Encounter (Signed)
 Patient called requesting message be sent to provider that she had an appointment with her OB/GYN during which they discussed increasing her routine dosage of  FLUoxetine  (PROZAC ) 40 MG capsule to address symptoms related to increasingly frequent hormonal fluctuation symptoms similar to PMDD for which she takes an additional 10 mg of FLUoxetine  (PROZAC ) only when she is on her period. Patient's OB/GYN told her she could not increase her SSRI and deferred to psychiatric provider. Pharmacy if action is taken is [id number] - [place], [place] - [address] Phone: [phone]  Fax: [phone]

## 2024-07-08 NOTE — Telephone Encounter (Signed)
 Spoke to patient. Informed her of the following.  Nothing Further Needed at this time.

## 2024-08-09 ENCOUNTER — Telehealth (HOSPITAL_COMMUNITY): Payer: Self-pay | Admitting: Psychiatry

## 2024-08-09 MED ORDER — FLUOXETINE HCL 10 MG PO CAPS
10.0000 mg | ORAL_CAPSULE | Freq: Every day | ORAL | 0 refills | Status: DC
Start: 2024-08-09 — End: 2024-09-01

## 2024-08-09 NOTE — Telephone Encounter (Signed)
 Received fax from patient's pharmacy requesting 90-day supply (90 capsules) of FLUoxetine  (PROZAC ) 10 MG capsule due to insurance preference.   [id number] - [place], [place] - [address] (Ph: [phone])   Last ordered: 07/07/2024 - 30 capsules Last visit: 06/30/2024 Next visit: 09/01/2024

## 2024-09-01 ENCOUNTER — Telehealth (HOSPITAL_COMMUNITY): Admitting: Psychiatry

## 2024-09-01 ENCOUNTER — Encounter (HOSPITAL_COMMUNITY): Payer: Self-pay | Admitting: Psychiatry

## 2024-09-01 DIAGNOSIS — F411 Generalized anxiety disorder: Secondary | ICD-10-CM

## 2024-09-01 DIAGNOSIS — F0630 Mood disorder due to known physiological condition, unspecified: Secondary | ICD-10-CM

## 2024-09-01 DIAGNOSIS — F331 Major depressive disorder, recurrent, moderate: Secondary | ICD-10-CM | POA: Diagnosis not present

## 2024-09-01 MED ORDER — BUPROPION HCL ER (XL) 150 MG PO TB24: 150.0000 mg | ORAL_TABLET | Freq: Every day | ORAL | 0 refills | Status: AC

## 2024-09-01 MED ORDER — FLUOXETINE HCL 10 MG PO CAPS: 10.0000 mg | ORAL_CAPSULE | Freq: Every day | ORAL | 0 refills | Status: AC

## 2024-09-01 MED ORDER — FLUOXETINE HCL 40 MG PO CAPS: 40.0000 mg | ORAL_CAPSULE | Freq: Every day | ORAL | 0 refills | Status: DC

## 2024-09-01 NOTE — Progress Notes (Addendum)
 [facility] Re establish care   Patient Identification: Beverly Anderson MRN:  [id number] Date of Evaluation:  09/01/2024 Referral Source: Donald Cowman, [place] Chief Complaint:    depression follow up  Visit Diagnosis:    ICD-10-CM   1. Moderate episode of recurrent major depressive disorder (HCC)  F33.1     2. GAD (generalized anxiety disorder)  F41.1     3. 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  Location: Patient: home Provider: home office   I discussed the limitations of evaluation and management by telemedicine and the availability of in person appointments. The patient expressed understanding and agreed to proceed.      I discussed the assessment and treatment plan with the patient. The patient was provided an opportunity to ask questions and all were answered. The patient agreed with the plan and demonstrated an understanding of the instructions.   The patient was advised to call back or seek an in-person evaluation if the symptoms worsen or if the condition fails to improve as anticipated.  I provided 18 minutes of non-face-to-face time during this encounter.   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  She is also having PMDD symptoms or hot flashes she is connected with her OB/GYN but is not a candidate for hormone therapy because of breast cancer history and family.  Prozac  has been increased from 40 to 50 mg that has helped some of the symptoms  She has lost her dog after 16 years and is going through grief as well but she does have a good support system she was able to sell her house and has moved to [facility]  Stress level is manageable except for hot flashes  Depression :fluctuates but more during the menstrual cycle Aggravating factor: Hot flashes, difficult childhood Modifying factors : animals, dogs that Duration 11 years plus Severity : Manageable  Past Medical History:  Past Medical History:  Diagnosis Date   Brain tumor (benign) (HCC)    Stroke [facility])    as a child    Past Surgical History:  Procedure Laterality Date   CERVICAL ABLATION     Family History: History reviewed. No pertinent family history. Social History:   Social History   Socioeconomic History   Marital status: Single    Spouse name: Not on file   Number of children: Not on file   Years of education: Not on file   Highest education level: Not on file  Occupational History   Not on file  Tobacco Use   Smoking status: Never   Smokeless tobacco: Never  Vaping Use   Vaping status: Never Used  Substance and Sexual Activity   Alcohol use:  No    Alcohol/week: 0.0 standard drinks of alcohol   Drug use: No   Sexual activity: Yes    Partners: Male  Other Topics Concern   Not on file  Social History Narrative   Not on file   Social Drivers of Health   Financial Resource Strain: Medium Risk (07/07/2024)   Received from [facility]   Overall Financial Resource Strain (CARDIA)    How hard is it for you to pay for the very basics like food, housing, medical care, and heating?: Somewhat hard  Food Insecurity: Food Insecurity Present (07/07/2024)   Received from  [facility]   Hunger Vital Sign    Within the past 12 months, you worried that your food would run out before you got the money to buy more.: Sometimes true    Within the past 12 months, the food you bought just didn't last and you didn't have money to get more.: Patient declined  Transportation Needs: No Transportation Needs (07/07/2024)   Received from [facility] - Transportation    In the past 12 months, has lack of transportation kept you from medical appointments or from getting medications?: No    In the past 12 months, has lack of transportation kept you from meetings, work, or from getting things needed for daily living?: No  Physical Activity: Sufficiently Active (07/07/2024)   Received from [facility]   Exercise Vital Sign    On average, how many days per week do you engage in moderate to strenuous exercise (like a brisk walk)?: 4 days    On average, how many minutes do you engage in exercise at this level?: 60 min  Stress: Stress Concern Present (07/07/2024)   Received from [facility] - Occupational Stress Questionnaire    Do you feel stress - tense, restless, nervous, or anxious, or unable to sleep at night because your mind is troubled all the time - these days?: Rather much  Social Connections: Moderately Integrated (07/07/2024)   Received from [facility]   Social Network    How would you rate your social network (family, work, friends)?: Adequate participation with social networks        Psychiatric Specialty Exam: Medication Refill Pertinent negatives include no chest pain.    Review of Systems  Cardiovascular:  Negative for chest pain.  Neurological:  Negative for tremors.  Psychiatric/Behavioral:  Negative for hallucinations.     There were no vitals taken for this visit.There is no height or weight on file to calculate BMI.  General Appearance: casual  Eye Contact: fair  Speech:  Normal Rate   Volume:  Normal  Mood: Fair  Affect: congruent  Thought Process:  Coherent  Orientation:  Full (Time, Place, and Person)  Thought Content:  Rumination  Suicidal Thoughts:  No  Homicidal Thoughts:  No  Memory:  Immediate;   Fair Recent;   Fair  Judgement:  Fair  Insight:  Shallow  Psychomotor Activity:  Normal  Concentration:  Fair  Recall:  [employer] of Knowledge:Fair  Language: Fair  Akathisia:  Negative  Handed:  Right  AIMS (if indicated):    Assets:  Desire for Improvement Social Support  ADL's:  Intact  Cognition: WNL  Sleep:  fair    Allergies:   Allergies  Allergen Reactions   Cefaclor Hives   Erythromycin Hives   Shellfish-Derived Products Hives   Current Medications: Current Outpatient Medications  Medication Sig  Dispense Refill   buPROPion  (WELLBUTRIN  XL) 150 MG 24 hr tablet Take 1 tablet (150 mg total) by mouth daily. 90 tablet 0   busPIRone  (BUSPAR ) 7.5 MG tablet Take 1 tablet (7.5 mg total) by mouth daily. 90 tablet 0   etonogestrel-ethinyl estradiol (NUVARING) 0.12-0.015 MG/24HR vaginal ring Place 1 ring vaginally every month.     FLUoxetine  (PROZAC ) 10 MG capsule Take 1 capsule (10 mg total) by mouth daily. 10MG  DURING THE CYCLE PERIOD WITH 40MG  90 capsule 0   FLUoxetine  (PROZAC ) 40 MG capsule Take 1 capsule (40 mg total) by mouth daily. 90 capsule 0   loratadine (CLARITIN) 10 MG tablet Take 10 mg by mouth daily.     No current facility-administered medications for this visit.      Treatment Plan Summary: Medication management and Plan as follows  Prior documentation reviewed  Depression  MDD recurrent; manageable continue Wellbutrin  and Prozac  at dose of 50 mg  2. GAD; manageable continue Prozac  50 mg and also reviewed sleep hygiene consider therapy if needed for grief she does have a good support system 3. Pre menstrjual dysphoria: Hot flashes does upset her's she is not a candidate for hormone therapy she will follow-up with her primary care  physician we discussed about general things that can help hot flashes including temperature and sleep hygiene . Will continue treat underlying depression as stated in MDD recurrent as primary   Follow-up in 4 to 6 months or earlier if needed renewed medication questions addressed    Jackey Flight 10/1/202510:42 AM

## 2024-11-28 ENCOUNTER — Other Ambulatory Visit (HOSPITAL_COMMUNITY): Payer: Self-pay | Admitting: Psychiatry

## 2025-03-02 ENCOUNTER — Telehealth (HOSPITAL_COMMUNITY): Admitting: Psychiatry
